# Patient Record
Sex: Female | Born: 1950 | Race: White | Hispanic: No | Marital: Single | Smoking: Never smoker
Health system: Southern US, Community
[De-identification: ages and names within clinical notes are randomized; demographics above are authoritative.]

## PROBLEM LIST (undated history)

## (undated) DIAGNOSIS — F419 Anxiety disorder, unspecified: Secondary | ICD-10-CM

## (undated) HISTORY — PX: DILATION AND CURETTAGE OF UTERUS: SHX78

---

## 1998-10-05 ENCOUNTER — Other Ambulatory Visit: Admission: RE | Admit: 1998-10-05 | Discharge: 1998-10-05 | Payer: Self-pay | Admitting: *Deleted

## 2000-08-08 ENCOUNTER — Other Ambulatory Visit: Admission: RE | Admit: 2000-08-08 | Discharge: 2000-08-08 | Payer: Self-pay | Admitting: *Deleted

## 2001-04-11 ENCOUNTER — Other Ambulatory Visit: Admission: RE | Admit: 2001-04-11 | Discharge: 2001-04-11 | Payer: Self-pay | Admitting: *Deleted

## 2001-04-11 ENCOUNTER — Encounter (INDEPENDENT_AMBULATORY_CARE_PROVIDER_SITE_OTHER): Payer: Self-pay | Admitting: Specialist

## 2005-01-24 ENCOUNTER — Other Ambulatory Visit: Admission: RE | Admit: 2005-01-24 | Discharge: 2005-01-24 | Payer: Self-pay | Admitting: *Deleted

## 2008-09-02 ENCOUNTER — Other Ambulatory Visit: Admission: RE | Admit: 2008-09-02 | Discharge: 2008-09-02 | Payer: Self-pay | Admitting: *Deleted

## 2010-02-07 DIAGNOSIS — K219 Gastro-esophageal reflux disease without esophagitis: Secondary | ICD-10-CM | POA: Insufficient documentation

## 2010-02-07 DIAGNOSIS — J302 Other seasonal allergic rhinitis: Secondary | ICD-10-CM | POA: Insufficient documentation

## 2010-02-07 DIAGNOSIS — D126 Benign neoplasm of colon, unspecified: Secondary | ICD-10-CM | POA: Insufficient documentation

## 2010-02-07 DIAGNOSIS — Z8679 Personal history of other diseases of the circulatory system: Secondary | ICD-10-CM | POA: Insufficient documentation

## 2010-04-03 DIAGNOSIS — F5104 Psychophysiologic insomnia: Secondary | ICD-10-CM | POA: Insufficient documentation

## 2010-04-03 DIAGNOSIS — R5383 Other fatigue: Secondary | ICD-10-CM | POA: Insufficient documentation

## 2013-03-12 DIAGNOSIS — R350 Frequency of micturition: Secondary | ICD-10-CM | POA: Insufficient documentation

## 2014-11-23 ENCOUNTER — Encounter (HOSPITAL_COMMUNITY): Payer: Self-pay | Admitting: Emergency Medicine

## 2014-11-23 ENCOUNTER — Emergency Department (HOSPITAL_COMMUNITY)
Admission: EM | Admit: 2014-11-23 | Discharge: 2014-11-24 | Disposition: A | Discharge status: Home / Self Care | Payer: No Typology Code available for payment source | Attending: Emergency Medicine | Admitting: Emergency Medicine

## 2014-11-23 DIAGNOSIS — R197 Diarrhea, unspecified: Secondary | ICD-10-CM | POA: Diagnosis not present

## 2014-11-23 DIAGNOSIS — Z8659 Personal history of other mental and behavioral disorders: Secondary | ICD-10-CM | POA: Diagnosis not present

## 2014-11-23 DIAGNOSIS — R112 Nausea with vomiting, unspecified: Secondary | ICD-10-CM

## 2014-11-23 DIAGNOSIS — M791 Myalgia: Secondary | ICD-10-CM | POA: Diagnosis not present

## 2014-11-23 HISTORY — DX: Anxiety disorder, unspecified: F41.9

## 2014-11-23 NOTE — ED Notes (Signed)
Pt. reports emesis , diarrhea , body aches and fatigue onset this morning

## 2014-11-24 ENCOUNTER — Encounter (HOSPITAL_COMMUNITY): Payer: Self-pay | Admitting: Emergency Medicine

## 2014-11-24 LAB — CBC WITH DIFFERENTIAL/PLATELET
Basophils Absolute: 0 10*3/uL (ref 0.0–0.1)
Basophils Relative: 0 % (ref 0–1)
EOS PCT: 0 % (ref 0–5)
Eosinophils Absolute: 0 10*3/uL (ref 0.0–0.7)
HEMATOCRIT: 40 % (ref 36.0–46.0)
Hemoglobin: 13.7 g/dL (ref 12.0–15.0)
LYMPHS ABS: 0.2 10*3/uL — AB (ref 0.7–4.0)
Lymphocytes Relative: 3 % — ABNORMAL LOW (ref 12–46)
MCH: 30 pg (ref 26.0–34.0)
MCHC: 34.3 g/dL (ref 30.0–36.0)
MCV: 87.7 fL (ref 78.0–100.0)
Monocytes Absolute: 0.4 10*3/uL (ref 0.1–1.0)
Monocytes Relative: 6 % (ref 3–12)
NEUTROS ABS: 5.1 10*3/uL (ref 1.7–7.7)
Neutrophils Relative %: 91 % — ABNORMAL HIGH (ref 43–77)
Platelets: 139 10*3/uL — ABNORMAL LOW (ref 150–400)
RBC: 4.56 MIL/uL (ref 3.87–5.11)
RDW: 13.7 % (ref 11.5–15.5)
WBC: 5.6 10*3/uL (ref 4.0–10.5)

## 2014-11-24 LAB — COMPREHENSIVE METABOLIC PANEL
ALK PHOS: 55 U/L (ref 39–117)
ALT: 17 U/L (ref 0–35)
AST: 23 U/L (ref 0–37)
Albumin: 4.1 g/dL (ref 3.5–5.2)
Anion gap: 11 (ref 5–15)
BILIRUBIN TOTAL: 0.6 mg/dL (ref 0.3–1.2)
BUN: 15 mg/dL (ref 6–23)
CALCIUM: 9 mg/dL (ref 8.4–10.5)
CHLORIDE: 102 meq/L (ref 96–112)
CO2: 19 mmol/L (ref 19–32)
Creatinine, Ser: 0.99 mg/dL (ref 0.50–1.10)
GFR calc Af Amer: 69 mL/min — ABNORMAL LOW (ref >90)
GFR, EST NON AFRICAN AMERICAN: 60 mL/min — AB (ref >90)
GLUCOSE: 131 mg/dL — AB (ref 70–99)
Potassium: 3.5 mmol/L (ref 3.5–5.1)
SODIUM: 132 mmol/L — AB (ref 135–145)
Total Protein: 6.3 g/dL (ref 6.0–8.3)

## 2014-11-24 MED ORDER — KETOROLAC TROMETHAMINE 30 MG/ML IJ SOLN
30.0000 mg | Freq: Once | INTRAMUSCULAR | Status: AC
  Administered 2014-11-24: 30 mg via INTRAVENOUS
  Filled 2014-11-24: qty 1

## 2014-11-24 MED ORDER — SODIUM CHLORIDE 0.9 % IV BOLUS (SEPSIS)
1000.0000 mL | Freq: Once | INTRAVENOUS | Status: AC
  Administered 2014-11-24: 1000 mL via INTRAVENOUS

## 2014-11-24 MED ORDER — DICYCLOMINE HCL 10 MG/ML IM SOLN
20.0000 mg | Freq: Once | INTRAMUSCULAR | Status: AC
  Administered 2014-11-24: 20 mg via INTRAMUSCULAR
  Filled 2014-11-24: qty 2

## 2014-11-24 MED ORDER — ONDANSETRON HCL 4 MG/2ML IJ SOLN
4.0000 mg | Freq: Once | INTRAMUSCULAR | Status: AC
  Administered 2014-11-24: 4 mg via INTRAVENOUS
  Filled 2014-11-24: qty 2

## 2014-11-24 NOTE — ED Provider Notes (Signed)
CSN: [id number]     Arrival date & time 11/23/14  2311 History  This chart was scribed for Alicia Furukawa Alfonso Patten, MD by Evelene Croon, ED Scribe. This patient was seen in room B14C/B14C and the patient's care was started 12:05 AM.   Chief Complaint  Patient presents with  . Emesis  . Diarrhea     Patient is a 63 y.o. female presenting with vomiting and diarrhea. The history is provided by the patient. No language interpreter was used.  Emesis Severity:  Mild Duration:  1 day Timing:  Rare Quality:  Stomach contents Progression:  Resolved Chronicity:  New Recent urination:  Normal Relieved by:  Nothing Worsened by:  Nothing tried Associated symptoms: diarrhea and myalgias   Associated symptoms: no chills   Diarrhea:    Quality:  Watery   Severity:  Moderate   Duration:  1 day   Timing:  Sporadic   Progression:  Resolved Risk factors: no alcohol use   Diarrhea Associated symptoms: myalgias and vomiting   Associated symptoms: no chills and no fever      HPI Comments:  Alicia Dominguez is a 63 y.o. female who presents to the Emergency Department complaining of nausea, vomiting and diarrhea since about 0600 this am. She reports 2 episodes of vomiting and multiple episode of diarrhea. She took and imodium-like medication with moderate relief of the diarrhea. The last episode of either was around 1400. She also reports generalized myalgia. She is unsure of sick contacts; notes she has been visiting her husband in the hospital for the past 2 weeks     Past Medical History  Diagnosis Date  . Anxiety    Past Surgical History  Procedure Laterality Date  . Dilation and curettage of uterus     No family history on file. History  Substance Use Topics  . Smoking status: Never Smoker   . Smokeless tobacco: Not on file  . Alcohol Use: No   OB History    No data available     Review of Systems  Constitutional: Negative for fever and chills.  Respiratory: Negative for  shortness of breath.   Cardiovascular: Negative for chest pain.  Gastrointestinal: Positive for vomiting and diarrhea.  Musculoskeletal: Positive for myalgias.  All other systems reviewed and are negative.     Allergies  Codeine and Septra  Home Medications   Prior to Admission medications   Not on File   BP 103/68 mmHg  Pulse 109  Temp(Src) 99 F (37.2 C) (Oral)  Resp 14  Ht 5\' 4"  (1.626 m)  Wt 133 lb (60.328 kg)  BMI 22.82 kg/m2  SpO2 97% Physical Exam  Constitutional: She is oriented to person, place, and time. She appears well-developed and well-nourished. No distress.  HENT:  Head: Normocephalic and atraumatic.  Mouth/Throat: Oropharynx is clear and moist.  Eyes: Conjunctivae are normal. Pupils are equal, round, and reactive to light.  Neck: Normal range of motion. Neck supple.  Cardiovascular: Normal rate, regular rhythm and normal heart sounds.   Pulmonary/Chest: Effort normal and breath sounds normal. No respiratory distress. She has no wheezes. She has no rales.  Abdominal: Soft. She exhibits no distension. There is no tenderness. There is no rebound and no guarding.  Diffusely hyperactive bowel sounds  Musculoskeletal: Normal range of motion.  Neurological: She is alert and oriented to person, place, and time. She has normal reflexes.  Skin: Skin is warm and dry. She is not diaphoretic.  Psychiatric: She has a normal mood  and affect.  Nursing note and vitals reviewed.   ED Course  Procedures   DIAGNOSTIC STUDIES:  Oxygen Saturation is 97% on RA, normal by my interpretation.    COORDINATION OF CARE:  12:12 AM Discussed treatment plan with pt at bedside and pt agreed to plan.  Labs Review Labs Reviewed  CBC WITH DIFFERENTIAL  COMPREHENSIVE METABOLIC PANEL    Imaging Review No results found.   EKG Interpretation None      MDM   Final diagnoses:  None   Symptoms are consistent with viral n/v/d.  Likely picked up visiting loved on at  hospital.   Medications  sodium chloride 0.9 % bolus 1,000 mL (0 mLs Intravenous Stopped 11/24/14 0106)  ondansetron (ZOFRAN) injection 4 mg (4 mg Intravenous Given 11/24/14 0017)  ketorolac (TORADOL) 30 MG/ML injection 30 mg (30 mg Intravenous Given 11/24/14 0018)  dicyclomine (BENTYL) injection 20 mg (20 mg Intramuscular Given 11/24/14 0104)   Improved post medication PO challenged successfully.  I personally performed the services described in this documentation, which was scribed in my presence. The recorded information has been reviewed and is accurate.     Carlisle Beers, MD 11/24/14 [phone]

## 2014-11-24 NOTE — ED Notes (Signed)
Pt given ginger ale and crackers per EDP for a PO challenge.

## 2017-01-09 DIAGNOSIS — H18603 Keratoconus, unspecified, bilateral: Secondary | ICD-10-CM | POA: Diagnosis not present

## 2017-01-09 DIAGNOSIS — H25813 Combined forms of age-related cataract, bilateral: Secondary | ICD-10-CM | POA: Diagnosis not present

## 2017-01-09 DIAGNOSIS — H1789 Other corneal scars and opacities: Secondary | ICD-10-CM | POA: Diagnosis not present

## 2017-01-18 DIAGNOSIS — Z6822 Body mass index (BMI) 22.0-22.9, adult: Secondary | ICD-10-CM | POA: Diagnosis not present

## 2017-01-18 DIAGNOSIS — K219 Gastro-esophageal reflux disease without esophagitis: Secondary | ICD-10-CM | POA: Diagnosis not present

## 2017-01-18 DIAGNOSIS — K589 Irritable bowel syndrome without diarrhea: Secondary | ICD-10-CM | POA: Insufficient documentation

## 2017-01-18 DIAGNOSIS — R197 Diarrhea, unspecified: Secondary | ICD-10-CM | POA: Diagnosis not present

## 2017-01-18 DIAGNOSIS — R634 Abnormal weight loss: Secondary | ICD-10-CM | POA: Insufficient documentation

## 2017-01-18 DIAGNOSIS — R5383 Other fatigue: Secondary | ICD-10-CM | POA: Diagnosis not present

## 2017-01-31 DIAGNOSIS — Z79899 Other long term (current) drug therapy: Secondary | ICD-10-CM | POA: Diagnosis not present

## 2017-01-31 DIAGNOSIS — R5383 Other fatigue: Secondary | ICD-10-CM | POA: Diagnosis not present

## 2017-02-07 DIAGNOSIS — Z1389 Encounter for screening for other disorder: Secondary | ICD-10-CM | POA: Diagnosis not present

## 2017-02-07 DIAGNOSIS — K219 Gastro-esophageal reflux disease without esophagitis: Secondary | ICD-10-CM | POA: Diagnosis not present

## 2017-02-07 DIAGNOSIS — R030 Elevated blood-pressure reading, without diagnosis of hypertension: Secondary | ICD-10-CM | POA: Insufficient documentation

## 2017-02-07 DIAGNOSIS — Z6822 Body mass index (BMI) 22.0-22.9, adult: Secondary | ICD-10-CM | POA: Diagnosis not present

## 2017-02-07 DIAGNOSIS — Z0000 Encounter for general adult medical examination without abnormal findings: Secondary | ICD-10-CM | POA: Diagnosis not present

## 2017-02-07 DIAGNOSIS — M542 Cervicalgia: Secondary | ICD-10-CM | POA: Diagnosis not present

## 2017-02-07 DIAGNOSIS — F5104 Psychophysiologic insomnia: Secondary | ICD-10-CM | POA: Diagnosis not present

## 2017-02-07 DIAGNOSIS — N183 Chronic kidney disease, stage 3 (moderate): Secondary | ICD-10-CM | POA: Diagnosis not present

## 2017-02-14 DIAGNOSIS — Z1212 Encounter for screening for malignant neoplasm of rectum: Secondary | ICD-10-CM | POA: Diagnosis not present

## 2017-02-15 DIAGNOSIS — Z6822 Body mass index (BMI) 22.0-22.9, adult: Secondary | ICD-10-CM | POA: Diagnosis not present

## 2017-02-15 DIAGNOSIS — R112 Nausea with vomiting, unspecified: Secondary | ICD-10-CM | POA: Diagnosis not present

## 2017-02-15 DIAGNOSIS — K219 Gastro-esophageal reflux disease without esophagitis: Secondary | ICD-10-CM | POA: Diagnosis not present

## 2017-02-18 DIAGNOSIS — T39395A Adverse effect of other nonsteroidal anti-inflammatory drugs [NSAID], initial encounter: Secondary | ICD-10-CM | POA: Insufficient documentation

## 2017-02-18 DIAGNOSIS — K2960 Other gastritis without bleeding: Secondary | ICD-10-CM | POA: Diagnosis not present

## 2017-07-09 DIAGNOSIS — Z01419 Encounter for gynecological examination (general) (routine) without abnormal findings: Secondary | ICD-10-CM | POA: Diagnosis not present

## 2017-07-09 DIAGNOSIS — M8588 Other specified disorders of bone density and structure, other site: Secondary | ICD-10-CM | POA: Diagnosis not present

## 2017-07-09 DIAGNOSIS — Z6822 Body mass index (BMI) 22.0-22.9, adult: Secondary | ICD-10-CM | POA: Diagnosis not present

## 2017-07-09 DIAGNOSIS — N958 Other specified menopausal and perimenopausal disorders: Secondary | ICD-10-CM | POA: Diagnosis not present

## 2017-07-09 DIAGNOSIS — Z124 Encounter for screening for malignant neoplasm of cervix: Secondary | ICD-10-CM | POA: Diagnosis not present

## 2017-07-09 DIAGNOSIS — Z1231 Encounter for screening mammogram for malignant neoplasm of breast: Secondary | ICD-10-CM | POA: Diagnosis not present

## 2017-09-11 ENCOUNTER — Other Ambulatory Visit: Payer: Self-pay | Admitting: Internal Medicine

## 2017-09-11 DIAGNOSIS — K219 Gastro-esophageal reflux disease without esophagitis: Secondary | ICD-10-CM | POA: Diagnosis not present

## 2017-09-11 DIAGNOSIS — R112 Nausea with vomiting, unspecified: Secondary | ICD-10-CM | POA: Diagnosis not present

## 2017-09-11 DIAGNOSIS — F418 Other specified anxiety disorders: Secondary | ICD-10-CM | POA: Diagnosis not present

## 2017-09-11 DIAGNOSIS — Z23 Encounter for immunization: Secondary | ICD-10-CM | POA: Diagnosis not present

## 2017-09-11 DIAGNOSIS — Z6821 Body mass index (BMI) 21.0-21.9, adult: Secondary | ICD-10-CM | POA: Diagnosis not present

## 2017-09-11 DIAGNOSIS — K589 Irritable bowel syndrome without diarrhea: Secondary | ICD-10-CM | POA: Diagnosis not present

## 2017-09-11 DIAGNOSIS — F419 Anxiety disorder, unspecified: Secondary | ICD-10-CM | POA: Insufficient documentation

## 2017-09-13 ENCOUNTER — Other Ambulatory Visit: Payer: Self-pay

## 2017-09-16 ENCOUNTER — Ambulatory Visit
Admission: RE | Admit: 2017-09-16 | Discharge: 2017-09-16 | Disposition: A | Discharge status: Home / Self Care | Payer: Medicare Other | Source: Ambulatory Visit | Attending: Internal Medicine | Admitting: Internal Medicine

## 2017-09-16 DIAGNOSIS — R112 Nausea with vomiting, unspecified: Secondary | ICD-10-CM

## 2017-09-16 DIAGNOSIS — K8020 Calculus of gallbladder without cholecystitis without obstruction: Secondary | ICD-10-CM | POA: Diagnosis not present

## 2017-09-18 DIAGNOSIS — K8020 Calculus of gallbladder without cholecystitis without obstruction: Secondary | ICD-10-CM | POA: Insufficient documentation

## 2017-10-04 DIAGNOSIS — K8020 Calculus of gallbladder without cholecystitis without obstruction: Secondary | ICD-10-CM | POA: Diagnosis not present

## 2017-10-22 DIAGNOSIS — F32A Depression, unspecified: Secondary | ICD-10-CM | POA: Insufficient documentation

## 2017-10-22 DIAGNOSIS — S61452A Open bite of left hand, initial encounter: Secondary | ICD-10-CM | POA: Diagnosis not present

## 2018-02-10 DIAGNOSIS — Z79899 Other long term (current) drug therapy: Secondary | ICD-10-CM | POA: Diagnosis not present

## 2018-02-10 DIAGNOSIS — N183 Chronic kidney disease, stage 3 (moderate): Secondary | ICD-10-CM | POA: Diagnosis not present

## 2018-02-10 DIAGNOSIS — R82998 Other abnormal findings in urine: Secondary | ICD-10-CM | POA: Diagnosis not present

## 2018-02-17 DIAGNOSIS — Z0000 Encounter for general adult medical examination without abnormal findings: Secondary | ICD-10-CM | POA: Diagnosis not present

## 2018-02-17 DIAGNOSIS — R030 Elevated blood-pressure reading, without diagnosis of hypertension: Secondary | ICD-10-CM | POA: Diagnosis not present

## 2018-02-17 DIAGNOSIS — K8020 Calculus of gallbladder without cholecystitis without obstruction: Secondary | ICD-10-CM | POA: Diagnosis not present

## 2018-02-17 DIAGNOSIS — F5104 Psychophysiologic insomnia: Secondary | ICD-10-CM | POA: Diagnosis not present

## 2018-02-17 DIAGNOSIS — F418 Other specified anxiety disorders: Secondary | ICD-10-CM | POA: Diagnosis not present

## 2018-02-17 DIAGNOSIS — Z1389 Encounter for screening for other disorder: Secondary | ICD-10-CM | POA: Diagnosis not present

## 2018-02-17 DIAGNOSIS — Z6823 Body mass index (BMI) 23.0-23.9, adult: Secondary | ICD-10-CM | POA: Diagnosis not present

## 2018-07-17 DIAGNOSIS — L239 Allergic contact dermatitis, unspecified cause: Secondary | ICD-10-CM | POA: Diagnosis not present

## 2019-02-17 DIAGNOSIS — Z79899 Other long term (current) drug therapy: Secondary | ICD-10-CM | POA: Diagnosis not present

## 2019-02-17 DIAGNOSIS — Z0000 Encounter for general adult medical examination without abnormal findings: Secondary | ICD-10-CM | POA: Insufficient documentation

## 2019-03-06 DIAGNOSIS — H9193 Unspecified hearing loss, bilateral: Secondary | ICD-10-CM | POA: Diagnosis not present

## 2019-03-06 DIAGNOSIS — F418 Other specified anxiety disorders: Secondary | ICD-10-CM | POA: Diagnosis not present

## 2019-03-06 DIAGNOSIS — F5104 Psychophysiologic insomnia: Secondary | ICD-10-CM | POA: Diagnosis not present

## 2019-03-06 DIAGNOSIS — R197 Diarrhea, unspecified: Secondary | ICD-10-CM | POA: Diagnosis not present

## 2019-03-06 DIAGNOSIS — Z1339 Encounter for screening examination for other mental health and behavioral disorders: Secondary | ICD-10-CM | POA: Diagnosis not present

## 2019-03-06 DIAGNOSIS — Z1331 Encounter for screening for depression: Secondary | ICD-10-CM | POA: Diagnosis not present

## 2019-03-06 DIAGNOSIS — K219 Gastro-esophageal reflux disease without esophagitis: Secondary | ICD-10-CM | POA: Diagnosis not present

## 2019-03-06 DIAGNOSIS — Z0000 Encounter for general adult medical examination without abnormal findings: Secondary | ICD-10-CM | POA: Diagnosis not present

## 2019-03-06 DIAGNOSIS — H9190 Unspecified hearing loss, unspecified ear: Secondary | ICD-10-CM | POA: Insufficient documentation

## 2019-12-24 ENCOUNTER — Encounter (HOSPITAL_COMMUNITY): Payer: Self-pay | Admitting: Emergency Medicine

## 2019-12-24 ENCOUNTER — Emergency Department (HOSPITAL_COMMUNITY)
Admission: EM | Admit: 2019-12-24 | Discharge: 2019-12-24 | Disposition: A | Discharge status: Home / Self Care | Payer: Medicare HMO | Attending: Emergency Medicine | Admitting: Emergency Medicine

## 2019-12-24 ENCOUNTER — Ambulatory Visit: Payer: Medicare HMO

## 2019-12-24 ENCOUNTER — Other Ambulatory Visit: Payer: Self-pay

## 2019-12-24 ENCOUNTER — Emergency Department (HOSPITAL_COMMUNITY): Payer: Medicare HMO

## 2019-12-24 DIAGNOSIS — Z79899 Other long term (current) drug therapy: Secondary | ICD-10-CM | POA: Insufficient documentation

## 2019-12-24 DIAGNOSIS — I10 Essential (primary) hypertension: Secondary | ICD-10-CM | POA: Diagnosis not present

## 2019-12-24 DIAGNOSIS — E876 Hypokalemia: Secondary | ICD-10-CM

## 2019-12-24 DIAGNOSIS — R55 Syncope and collapse: Secondary | ICD-10-CM | POA: Diagnosis not present

## 2019-12-24 DIAGNOSIS — Z20822 Contact with and (suspected) exposure to covid-19: Secondary | ICD-10-CM | POA: Insufficient documentation

## 2019-12-24 DIAGNOSIS — E860 Dehydration: Secondary | ICD-10-CM

## 2019-12-24 DIAGNOSIS — R202 Paresthesia of skin: Secondary | ICD-10-CM | POA: Diagnosis not present

## 2019-12-24 DIAGNOSIS — R000 Tachycardia, unspecified: Secondary | ICD-10-CM | POA: Diagnosis not present

## 2019-12-24 DIAGNOSIS — R112 Nausea with vomiting, unspecified: Secondary | ICD-10-CM

## 2019-12-24 DIAGNOSIS — I491 Atrial premature depolarization: Secondary | ICD-10-CM | POA: Diagnosis not present

## 2019-12-24 DIAGNOSIS — R29810 Facial weakness: Secondary | ICD-10-CM | POA: Diagnosis not present

## 2019-12-24 DIAGNOSIS — R531 Weakness: Secondary | ICD-10-CM | POA: Diagnosis not present

## 2019-12-24 DIAGNOSIS — Z23 Encounter for immunization: Secondary | ICD-10-CM

## 2019-12-24 LAB — HEPATIC FUNCTION PANEL
ALT: 17 U/L (ref 0–44)
AST: 18 U/L (ref 15–41)
Albumin: 3.8 g/dL (ref 3.5–5.0)
Alkaline Phosphatase: 72 U/L (ref 38–126)
Bilirubin, Direct: 0.1 mg/dL (ref 0.0–0.2)
Indirect Bilirubin: 0.4 mg/dL (ref 0.3–0.9)
Total Bilirubin: 0.5 mg/dL (ref 0.3–1.2)
Total Protein: 6.5 g/dL (ref 6.5–8.1)

## 2019-12-24 LAB — URINALYSIS, ROUTINE W REFLEX MICROSCOPIC
Bilirubin Urine: NEGATIVE
Glucose, UA: NEGATIVE mg/dL
Hgb urine dipstick: NEGATIVE
Ketones, ur: NEGATIVE mg/dL
Leukocytes,Ua: NEGATIVE
Nitrite: NEGATIVE
Protein, ur: NEGATIVE mg/dL
Specific Gravity, Urine: 1.005 (ref 1.005–1.030)
pH: 6 (ref 5.0–8.0)

## 2019-12-24 LAB — CBC
HCT: 39.1 % (ref 36.0–46.0)
Hemoglobin: 12.8 g/dL (ref 12.0–15.0)
MCH: 29.1 pg (ref 26.0–34.0)
MCHC: 32.7 g/dL (ref 30.0–36.0)
MCV: 88.9 fL (ref 80.0–100.0)
Platelets: 191 10*3/uL (ref 150–400)
RBC: 4.4 MIL/uL (ref 3.87–5.11)
RDW: 12.8 % (ref 11.5–15.5)
WBC: 4.3 10*3/uL (ref 4.0–10.5)
nRBC: 0 % (ref 0.0–0.2)

## 2019-12-24 LAB — BASIC METABOLIC PANEL
Anion gap: 12 (ref 5–15)
BUN: 10 mg/dL (ref 8–23)
CO2: 23 mmol/L (ref 22–32)
Calcium: 8.9 mg/dL (ref 8.9–10.3)
Chloride: 107 mmol/L (ref 98–111)
Creatinine, Ser: 0.95 mg/dL (ref 0.44–1.00)
GFR calc Af Amer: >60 mL/min (ref >60)
GFR calc non Af Amer: >60 mL/min (ref >60)
Glucose, Bld: 144 mg/dL — ABNORMAL HIGH (ref 70–99)
Potassium: 2.8 mmol/L — ABNORMAL LOW (ref 3.5–5.1)
Sodium: 142 mmol/L (ref 135–145)

## 2019-12-24 LAB — LIPASE, BLOOD: Lipase: 49 U/L (ref 11–51)

## 2019-12-24 LAB — POC SARS CORONAVIRUS 2 AG -  ED: SARS Coronavirus 2 Ag: NEGATIVE

## 2019-12-24 LAB — MAGNESIUM: Magnesium: 1.9 mg/dL (ref 1.7–2.4)

## 2019-12-24 LAB — CBG MONITORING, ED: Glucose-Capillary: 128 mg/dL — ABNORMAL HIGH (ref 70–99)

## 2019-12-24 MED ORDER — SODIUM CHLORIDE 0.9 % IV BOLUS (SEPSIS)
1000.0000 mL | Freq: Once | INTRAVENOUS | Status: AC
  Administered 2019-12-24: 1000 mL via INTRAVENOUS

## 2019-12-24 MED ORDER — POTASSIUM CHLORIDE CRYS ER 20 MEQ PO TBCR
40.0000 meq | EXTENDED_RELEASE_TABLET | Freq: Once | ORAL | Status: AC
  Administered 2019-12-24: 40 meq via ORAL
  Filled 2019-12-24: qty 2

## 2019-12-24 MED ORDER — POTASSIUM CHLORIDE ER 10 MEQ PO TBCR: 10.0000 meq | EXTENDED_RELEASE_TABLET | Freq: Every day | ORAL | 0 refills | Status: DC

## 2019-12-24 NOTE — ED Provider Notes (Signed)
[facility] Provider Note   CSN: [id number] Arrival date & time: 12/24/19  [id number]     History Chief Complaint  Patient presents with  . Weakness    Alicia Dominguez is a 69 y.o. female.  Patient is a 69 year old female with past medical history of anxiety presenting to the emergency department for near syncope this morning.  Patient reports that she woke up and walked down the stairs this morning and suddenly felt weak like she was going to pass out.  Reports feeling tingling sensation in her hands and feet.  She called 911 after this.  She reports that she has had some nausea, vomiting and diarrhea over the last 2 or 3 days.  Denies any abdominal pain, chest pain, shortness of breath, URI symptoms, fevers, dysuria.  In route EMS did orthostatic blood pressures which were positive and so fluids were started.  Patient reports that she is now feeling better after fluids.        Past Medical History:  Diagnosis Date  . Anxiety     There are no problems to display for this patient.   Past Surgical History:  Procedure Laterality Date  . DILATION AND CURETTAGE OF UTERUS       OB History   No obstetric history on file.     No family history on file.  Social History   Tobacco Use  . Smoking status: Never Smoker  Substance Use Topics  . Alcohol use: No  . Drug use: No    Home Medications Prior to Admission medications   Medication Sig Start Date End Date Taking? Authorizing Provider  acetaminophen (TYLENOL) 325 MG tablet Take 650 mg by mouth every 6 (six) hours as needed for fever.   Yes [provider]  esomeprazole (NEXIUM) 20 MG capsule Take 20 mg by mouth daily.   Yes [provider]  FLUoxetine (PROZAC) 20 MG capsule Take 20 mg by mouth daily. 11/01/19  Yes [provider]  loratadine (CLARITIN) 10 MG tablet Take 10 mg by mouth daily.   Yes [provider]  Probiotic Product (PROBIOTIC PO)  Take 1 capsule by mouth daily.   Yes [provider]  potassium chloride (KLOR-CON) 10 MEQ tablet Take 1 tablet (10 mEq total) by mouth daily for 5 days. 12/24/19 12/29/19  Alveria Apley, PA-C    Allergies    Codeine and Septra [sulfamethoxazole-trimethoprim]  Review of Systems   Review of Systems  Constitutional: Positive for fatigue. Negative for activity change, appetite change, chills, diaphoresis and fever.  HENT: Negative for congestion, rhinorrhea and sore throat.   Eyes: Negative for visual disturbance.  Respiratory: Negative for cough and shortness of breath.   Cardiovascular: Negative for chest pain.  Gastrointestinal: Positive for diarrhea, nausea and vomiting. Negative for abdominal pain, blood in stool, constipation and rectal pain.  Genitourinary: Negative for dysuria.  Musculoskeletal: Negative for back pain.  Skin: Negative for rash and wound.  Neurological: Positive for light-headedness. Negative for dizziness, syncope, facial asymmetry, speech difficulty, weakness, numbness and headaches.  Psychiatric/Behavioral: Negative for confusion.    Physical Exam Updated Vital Signs BP (!) 142/71   Pulse 93   Temp 98.6 F (37 C) (Oral)   Resp 13   Ht 5\' 4"  (1.626 m)   Wt 59 kg   SpO2 100%   BMI 22.31 kg/m   Physical Exam Vitals and nursing note reviewed.  Constitutional:      General: She is not in  acute distress.    Appearance: Normal appearance. She is normal weight. She is not ill-appearing, toxic-appearing or diaphoretic.  HENT:     Head: Normocephalic and atraumatic.     Mouth/Throat:     Mouth: Mucous membranes are moist.  Eyes:     Conjunctiva/sclera: Conjunctivae normal.  Cardiovascular:     Rate and Rhythm: Normal rate and regular rhythm.  Pulmonary:     Effort: Pulmonary effort is normal.     Breath sounds: Normal breath sounds.  Abdominal:     General: Abdomen is flat. There is no distension.     Tenderness: There is no abdominal  tenderness. There is no guarding.  Skin:    General: Skin is warm and dry.     Coloration: Skin is not pale.     Findings: No bruising or erythema.  Neurological:     Mental Status: She is alert.  Psychiatric:        Mood and Affect: Mood normal.     ED Results / Procedures / Treatments   Labs (all labs ordered are listed, but only abnormal results are displayed) Labs Reviewed  BASIC METABOLIC PANEL - Abnormal; Notable for the following components:      Result Value   Potassium 2.8 (*)    Glucose, Bld 144 (*)    All other components within normal limits  CBG MONITORING, ED - Abnormal; Notable for the following components:   Glucose-Capillary 128 (*)    All other components within normal limits  CBC  LIPASE, BLOOD  HEPATIC FUNCTION PANEL  MAGNESIUM  URINALYSIS, ROUTINE W REFLEX MICROSCOPIC  POC SARS CORONAVIRUS 2 AG -  ED    EKG EKG Interpretation  Date/Time:  Thursday December 24 2019 07:11:48 EST Ventricular Rate:  93 PR Interval:  158 QRS Duration: 80 QT Interval:  360 QTC Calculation: 447 R Axis:   36 Text Interpretation: Normal sinus rhythm with sinus arrhythmia nonspecific T waves No old tracing to compare Confirmed by Sherwood Gambler [phone]) on 12/24/2019 7:41:08 AM   Radiology DG Chest Portable 1 View  Result Date: 12/24/2019 CLINICAL DATA:  Near syncope EXAM: PORTABLE CHEST 1 VIEW COMPARISON:  None. FINDINGS: Lungs are clear. Heart size and pulmonary vascularity are normal. No adenopathy. No bone lesions. IMPRESSION: Lungs clear.  Cardiac silhouette normal.  No adenopathy. Electronically Signed   By: Lowella Grip III M.D.   On: 12/24/2019 08:30    Procedures Procedures (including critical care time)  Medications Ordered in ED Medications  sodium chloride 0.9 % bolus 1,000 mL (1,000 mLs Intravenous New Bag/Given 12/24/19 0815)  potassium chloride SA (KLOR-CON) CR tablet 40 mEq (40 mEq Oral Given 12/24/19 [id number])    ED Course  I have reviewed the  triage vital signs and the nursing notes.  Pertinent labs & imaging results that were available during my care of the patient were reviewed by me and considered in my medical decision making (see chart for details).  Clinical Course as of Dec 23 1004  Thu Dec 24, 2019  0900 Patient presenting with n/v/d and orthostatic hypotension. Overall well appearing, potassium of 2.8    [KM]  1004 Patient is feeling much improved after fluids.  Unremarkable work-up other than hypokalemia.  Given oral potassium and counseled on return precautions.  Patient also seen and evaluated by Dr. Verner Chol and plan agreed upon.   [KM]    Clinical Course User Index [KM] Kristine Royal   MDM Rules/Calculators/A&P  Based on review of vitals, medical screening exam, lab work and/or imaging, there does not appear to be an acute, emergent etiology for the patient's symptoms. Counseled pt on good return precautions and encouraged both PCP and ED follow-up as needed.  Prior to discharge, I also discussed incidental imaging findings with patient in detail and advised appropriate, recommended follow-up in detail.  Clinical Impression: 1. Dehydration   2. Nausea and vomiting, intractability of vomiting not specified, unspecified vomiting type   3. Hypokalemia     Disposition: Discharge  Prior to providing a prescription for a controlled substance, I independently reviewed the patient's recent prescription history on the [place]. The patient had no recent or regular prescriptions and was deemed appropriate for a brief, less than 3 day prescription of narcotic for acute analgesia.  This note was prepared with assistance of Systems analyst. Occasional wrong-word or sound-a-like substitutions may have occurred due to the inherent limitations of voice recognition software.  Final Clinical Impression(s) / ED Diagnoses Final  diagnoses:  Dehydration  Nausea and vomiting, intractability of vomiting not specified, unspecified vomiting type  Hypokalemia    Rx / DC Orders ED Discharge Orders         Ordered    potassium chloride (KLOR-CON) 10 MEQ tablet  Daily     12/24/19 1005           Kristine Royal 12/24/19 1006    Sherwood Gambler, MD 12/25/19 [phone]

## 2019-12-24 NOTE — ED Notes (Signed)
ED Provider at bedside. 

## 2019-12-24 NOTE — Progress Notes (Signed)
   Covid-19 Vaccination Clinic  Name:  Alicia Dominguez    MRN: [id number] DOB: 04/14/1951  12/24/2019  [name] was observed post Covid-19 immunization for 15 minutes without incidence. She was provided with Vaccine Information Sheet and instruction to access the V-Safe system.   [name] was instructed to call 911 with any severe reactions post vaccine: Marland Kitchen Difficulty breathing  . Swelling of your face and throat  . A fast heartbeat  . A bad rash all over your body  . Dizziness and weakness    Immunizations Administered    Name Date Dose VIS Date Route   Pfizer COVID-19 Vaccine 12/24/2019  5:55 PM 0.3 mL 11/13/2019 Intramuscular   Manufacturer: [place]   Lot: [id number]   [place]: [id number]

## 2019-12-24 NOTE — Discharge Instructions (Signed)
You are seen today for feeling unwell and almost passing out.  Your work-up revealed your potassium was low which is likely due to your vomiting and diarrhea.  We gave you some potassium pills.  He also appeared very dehydrated and your blood pressure dropped with standing.  This resolved after giving you IV fluids.  Please make sure you go home and stay hydrated with water and Gatorade.  Avoid caffeinated beverages as this can further dehydrate you. Thank you for allowing me to care for you today. Please return to the emergency department if you have new or worsening symptoms. Take your medications as instructed.

## 2019-12-24 NOTE — ED Notes (Signed)
Pt was able to ambulated from room to bathroom with steady gait and NO assistance needed. Pt stated "I feel fine." Pt had NO SOB or dizziness.

## 2019-12-24 NOTE — ED Triage Notes (Signed)
Pt arrives via EMS from home with reports of bilateral leg weakness and pins and needles feelings. Positive ortho for EMS. 160/80 sitting, 120/84 standing. Reports frequent N/V/D this past week but is her normal. 300 NS given, 20G LAC

## 2020-01-11 ENCOUNTER — Ambulatory Visit: Payer: Medicare HMO | Attending: Internal Medicine

## 2020-01-11 DIAGNOSIS — Z23 Encounter for immunization: Secondary | ICD-10-CM | POA: Insufficient documentation

## 2020-01-11 NOTE — Progress Notes (Signed)
   Covid-19 Vaccination Clinic  Name:  Jennylee Dellarocca    MRN: [id number] DOB: January 12, 1951  01/11/2020  Ms. Bowker was observed post Covid-19 immunization for 15 minutes without incidence. She was provided with Vaccine Information Sheet and instruction to access the V-Safe system.   Ms. [place] was instructed to call 911 with any severe reactions post vaccine: Marland Kitchen Difficulty breathing  . Swelling of your face and throat  . A fast heartbeat  . A bad rash all over your body  . Dizziness and weakness    Immunizations Administered    Name Date Dose VIS Date Route   Pfizer COVID-19 Vaccine 01/11/2020 10:11 AM 0.3 mL 11/13/2019 Intramuscular   Manufacturer: [place]   Lot: [id number]   [place]: [id number]

## 2020-01-21 ENCOUNTER — Ambulatory Visit
Admission: EM | Admit: 2020-01-21 | Discharge: 2020-01-21 | Disposition: A | Discharge status: Home / Self Care | Payer: Medicare HMO | Attending: Emergency Medicine | Admitting: Emergency Medicine

## 2020-01-21 ENCOUNTER — Encounter: Payer: Self-pay | Admitting: Emergency Medicine

## 2020-01-21 ENCOUNTER — Other Ambulatory Visit: Payer: Self-pay

## 2020-01-21 DIAGNOSIS — B029 Zoster without complications: Secondary | ICD-10-CM | POA: Diagnosis not present

## 2020-01-21 MED ORDER — VALACYCLOVIR HCL 1 G PO TABS: 1000.0000 mg | ORAL_TABLET | Freq: Three times a day (TID) | ORAL | 0 refills | Status: DC

## 2020-01-21 NOTE — ED Triage Notes (Signed)
Pt presents to [facility] for left lower back pain starting two days after a trip and fall over her dogs falling backwards onto carpet.   Pt states she has been taking 1-200mg  tablet of Ibuprofen every 6 hours for pain relief.  States she also slept on a heating pad, and now has blisters to the area as well.

## 2020-01-21 NOTE — Discharge Instructions (Addendum)
Take antiviral 3 times a day for 1 week. Follow up with PCP Monday via phone. Return for worsening pain, swelling, fever.

## 2020-01-21 NOTE — ED Notes (Signed)
Patient able to ambulate independently  

## 2020-01-21 NOTE — ED Provider Notes (Addendum)
[facility]    CSN: [id number] Arrival date & time: 01/21/20  1101      History   Chief Complaint Chief Complaint  Patient presents with  . Fall    HPI Alicia Dominguez is a 69 y.o. female scented for left low back pain that began after falling 1 week ago.  Denies head trauma, LOC.  No pop/snap/tearing sensation.  Denies saddle anesthesia, lower extremity numbness or weakness.  Has been ambulating well.  Taking 200 mg ibuprofen every 6 hours with moderate pain relief.  States she is seeking evaluation today as she developed worsening pain over the last couple days, now has a burning sensation and rash that she has developed over left posterior hip which she feels is attributed to using heating pad.   Past Medical History:  Diagnosis Date  . Anxiety     There are no problems to display for this patient.   Past Surgical History:  Procedure Laterality Date  . DILATION AND CURETTAGE OF UTERUS      OB History   No obstetric history on file.      Home Medications    Prior to Admission medications   Medication Sig Start Date End Date Taking? Authorizing Provider  acetaminophen (TYLENOL) 325 MG tablet Take 650 mg by mouth every 6 (six) hours as needed for fever.    [provider]  esomeprazole (NEXIUM) 20 MG capsule Take 20 mg by mouth daily.    [provider]  FLUoxetine (PROZAC) 20 MG capsule Take 20 mg by mouth daily. 11/01/19   [provider]  loratadine (CLARITIN) 10 MG tablet Take 10 mg by mouth daily.    [provider]  potassium chloride (KLOR-CON) 10 MEQ tablet Take 1 tablet (10 mEq total) by mouth daily for 5 days. 12/24/19 12/29/19  Madilyn Hook A, PA-C  Probiotic Product (PROBIOTIC PO) Take 1 capsule by mouth daily.    [provider]  valACYclovir (VALTREX) 1000 MG tablet Take 1 tablet (1,000 mg total) by mouth 3 (three) times daily. 01/21/20   Hall-Potvin, [place], PA-C    Family History Family  History  Problem Relation Age of Onset  . Hypertension Mother   . Alzheimer's disease Mother   . Diabetes Mother   . Lung cancer Father     Social History Social History   Tobacco Use  . Smoking status: Never Smoker  . Smokeless tobacco: Never Used  Substance Use Topics  . Alcohol use: No  . Drug use: No     Allergies   Codeine and Septra [sulfamethoxazole-trimethoprim]   Review of Systems As per HPI   Physical Exam Triage Vital Signs ED Triage Vitals  Enc Vitals Group     BP      Pulse      Resp      Temp      Temp src      SpO2      Weight      Height      Head Circumference      Peak Flow      Pain Score      Pain Loc      Pain Edu?      Excl. in [place]?    No data found.  Updated Vital Signs BP (!) 167/89 (BP Location: Left Arm)   Pulse 89   Temp (!) 97.5 F (36.4 C) (Temporal)   Resp 16   SpO2 97%   Visual Acuity Right Eye  Distance:   Left Eye Distance:   Bilateral Distance:    Right Eye Near:   Left Eye Near:    Bilateral Near:     Physical Exam Constitutional:      General: She is not in acute distress. HENT:     Head: Normocephalic and atraumatic.  Eyes:     General: No scleral icterus.    Pupils: Pupils are equal, round, and reactive to light.  Cardiovascular:     Rate and Rhythm: Normal rate.  Pulmonary:     Effort: Pulmonary effort is normal.  Musculoskeletal:        General: No deformity.     Cervical back: Normal.     Thoracic back: Normal.     Lumbar back: No spasms or bony tenderness. Normal range of motion. No scoliosis.       Back:  Skin:    Coloration: Skin is not jaundiced or pale.  Neurological:     Mental Status: She is alert and oriented to person, place, and time.     Gait: Gait normal.     Deep Tendon Reflexes: Reflexes normal.      UC Treatments / Results  Labs (all labs ordered are listed, but only abnormal results are displayed) Labs Reviewed - No data to display  EKG   Radiology No results  found.  Procedures Procedures (including critical care time)  Medications Ordered in UC Medications - No data to display  Initial Impression / Assessment and Plan / UC Course  I have reviewed the triage vital signs and the nursing notes.  Pertinent labs & imaging results that were available during my care of the patient were reviewed by me and considered in my medical decision making (see chart for details).     Afebrile, nontoxic in office today.  H&P consistent with shingles: New lesions within the last 72 hours-we will start valacyclovir as outlined below.  Patient to follow-up with PCP for further evaluation/management if needed.  We will also increase ibuprofen dosing for musculoskeletal pain status post fall.  Return precautions discussed, patient verbalized understanding and is agreeable to plan. Final Clinical Impressions(s) / UC Diagnoses   Final diagnoses:  Herpes zoster without complication     Discharge Instructions     Take antiviral 3 times a day for 1 week. Follow up with PCP Monday via phone. Return for worsening pain, swelling, fever.    ED Prescriptions    Medication Sig Dispense Auth. Provider   valACYclovir (VALTREX) 1000 MG tablet Take 1 tablet (1,000 mg total) by mouth 3 (three) times daily. 21 tablet Hall-Potvin, [place], PA-C     PDMP not reviewed this encounter.   Hall-Potvin, [place], PA-C 01/21/20 1257    Hall-Potvin, [place], [place] 01/21/20 1258

## 2020-03-21 DIAGNOSIS — Z0000 Encounter for general adult medical examination without abnormal findings: Secondary | ICD-10-CM | POA: Diagnosis not present

## 2020-03-21 DIAGNOSIS — Z79899 Other long term (current) drug therapy: Secondary | ICD-10-CM | POA: Diagnosis not present

## 2020-03-21 DIAGNOSIS — K589 Irritable bowel syndrome without diarrhea: Secondary | ICD-10-CM | POA: Diagnosis not present

## 2020-03-29 DIAGNOSIS — H18603 Keratoconus, unspecified, bilateral: Secondary | ICD-10-CM | POA: Diagnosis not present

## 2020-03-29 DIAGNOSIS — H52223 Regular astigmatism, bilateral: Secondary | ICD-10-CM | POA: Diagnosis not present

## 2020-03-29 DIAGNOSIS — H259 Unspecified age-related cataract: Secondary | ICD-10-CM | POA: Diagnosis not present

## 2020-05-03 DIAGNOSIS — N83209 Unspecified ovarian cyst, unspecified side: Secondary | ICD-10-CM | POA: Insufficient documentation

## 2020-05-05 DIAGNOSIS — E785 Hyperlipidemia, unspecified: Secondary | ICD-10-CM | POA: Insufficient documentation

## 2020-05-06 DIAGNOSIS — Z1231 Encounter for screening mammogram for malignant neoplasm of breast: Secondary | ICD-10-CM | POA: Diagnosis not present

## 2020-05-06 DIAGNOSIS — E876 Hypokalemia: Secondary | ICD-10-CM | POA: Insufficient documentation

## 2020-05-06 DIAGNOSIS — Z124 Encounter for screening for malignant neoplasm of cervix: Secondary | ICD-10-CM | POA: Diagnosis not present

## 2020-05-06 DIAGNOSIS — M816 Localized osteoporosis [Lequesne]: Secondary | ICD-10-CM | POA: Diagnosis not present

## 2020-05-06 DIAGNOSIS — M810 Age-related osteoporosis without current pathological fracture: Secondary | ICD-10-CM | POA: Diagnosis not present

## 2020-05-06 DIAGNOSIS — N3945 Continuous leakage: Secondary | ICD-10-CM | POA: Diagnosis not present

## 2020-05-06 DIAGNOSIS — Z6826 Body mass index (BMI) 26.0-26.9, adult: Secondary | ICD-10-CM | POA: Diagnosis not present

## 2020-05-06 DIAGNOSIS — N958 Other specified menopausal and perimenopausal disorders: Secondary | ICD-10-CM | POA: Diagnosis not present

## 2020-05-11 ENCOUNTER — Other Ambulatory Visit: Payer: Self-pay | Admitting: Obstetrics and Gynecology

## 2020-05-11 DIAGNOSIS — R928 Other abnormal and inconclusive findings on diagnostic imaging of breast: Secondary | ICD-10-CM

## 2020-05-20 ENCOUNTER — Ambulatory Visit: Payer: Medicare HMO

## 2020-05-20 ENCOUNTER — Other Ambulatory Visit: Payer: Self-pay

## 2020-05-20 ENCOUNTER — Ambulatory Visit
Admission: RE | Admit: 2020-05-20 | Discharge: 2020-05-20 | Disposition: A | Discharge status: Home / Self Care | Payer: Medicare HMO | Source: Ambulatory Visit | Attending: Obstetrics and Gynecology | Admitting: Obstetrics and Gynecology

## 2020-05-20 DIAGNOSIS — R922 Inconclusive mammogram: Secondary | ICD-10-CM | POA: Diagnosis not present

## 2020-05-20 DIAGNOSIS — R928 Other abnormal and inconclusive findings on diagnostic imaging of breast: Secondary | ICD-10-CM

## 2020-05-23 DIAGNOSIS — M810 Age-related osteoporosis without current pathological fracture: Secondary | ICD-10-CM | POA: Diagnosis not present

## 2020-05-23 DIAGNOSIS — M818 Other osteoporosis without current pathological fracture: Secondary | ICD-10-CM | POA: Diagnosis not present

## 2020-05-23 DIAGNOSIS — R32 Unspecified urinary incontinence: Secondary | ICD-10-CM | POA: Diagnosis not present

## 2020-06-07 DIAGNOSIS — R635 Abnormal weight gain: Secondary | ICD-10-CM | POA: Diagnosis not present

## 2020-06-07 DIAGNOSIS — M545 Low back pain: Secondary | ICD-10-CM | POA: Diagnosis not present

## 2020-06-07 DIAGNOSIS — K219 Gastro-esophageal reflux disease without esophagitis: Secondary | ICD-10-CM | POA: Diagnosis not present

## 2020-06-07 DIAGNOSIS — K5909 Other constipation: Secondary | ICD-10-CM | POA: Diagnosis not present

## 2020-06-07 DIAGNOSIS — Z1331 Encounter for screening for depression: Secondary | ICD-10-CM | POA: Diagnosis not present

## 2020-06-07 DIAGNOSIS — N393 Stress incontinence (female) (male): Secondary | ICD-10-CM | POA: Diagnosis not present

## 2020-06-07 DIAGNOSIS — Z0000 Encounter for general adult medical examination without abnormal findings: Secondary | ICD-10-CM | POA: Diagnosis not present

## 2020-06-07 DIAGNOSIS — F418 Other specified anxiety disorders: Secondary | ICD-10-CM | POA: Diagnosis not present

## 2020-06-07 DIAGNOSIS — M6281 Muscle weakness (generalized): Secondary | ICD-10-CM | POA: Diagnosis not present

## 2020-06-23 DIAGNOSIS — M545 Low back pain: Secondary | ICD-10-CM | POA: Diagnosis not present

## 2020-06-23 DIAGNOSIS — M6281 Muscle weakness (generalized): Secondary | ICD-10-CM | POA: Diagnosis not present

## 2020-06-23 DIAGNOSIS — N393 Stress incontinence (female) (male): Secondary | ICD-10-CM | POA: Diagnosis not present

## 2020-06-30 DIAGNOSIS — M545 Low back pain: Secondary | ICD-10-CM | POA: Diagnosis not present

## 2020-06-30 DIAGNOSIS — N393 Stress incontinence (female) (male): Secondary | ICD-10-CM | POA: Diagnosis not present

## 2020-06-30 DIAGNOSIS — M6281 Muscle weakness (generalized): Secondary | ICD-10-CM | POA: Diagnosis not present

## 2020-07-14 DIAGNOSIS — M6281 Muscle weakness (generalized): Secondary | ICD-10-CM | POA: Diagnosis not present

## 2020-07-14 DIAGNOSIS — N393 Stress incontinence (female) (male): Secondary | ICD-10-CM | POA: Diagnosis not present

## 2020-07-14 DIAGNOSIS — M545 Low back pain: Secondary | ICD-10-CM | POA: Diagnosis not present

## 2020-08-08 ENCOUNTER — Ambulatory Visit
Admission: EM | Admit: 2020-08-08 | Discharge: 2020-08-08 | Disposition: A | Discharge status: Home / Self Care | Payer: Medicare HMO | Attending: Family Medicine | Admitting: Family Medicine

## 2020-08-08 ENCOUNTER — Other Ambulatory Visit: Payer: Self-pay

## 2020-08-08 ENCOUNTER — Encounter: Payer: Self-pay | Admitting: Emergency Medicine

## 2020-08-08 DIAGNOSIS — N10 Acute tubulo-interstitial nephritis: Secondary | ICD-10-CM | POA: Diagnosis not present

## 2020-08-08 LAB — POCT URINALYSIS DIP (MANUAL ENTRY)
Bilirubin, UA: NEGATIVE
Glucose, UA: NEGATIVE mg/dL
Ketones, POC UA: NEGATIVE mg/dL
Nitrite, UA: POSITIVE — AB
Protein Ur, POC: 30 mg/dL — AB
Spec Grav, UA: >=1.03 — AB (ref 1.010–1.025)
Urobilinogen, UA: 0.2 E.U./dL
pH, UA: 5.5 (ref 5.0–8.0)

## 2020-08-08 MED ORDER — NITROFURANTOIN MONOHYD MACRO 100 MG PO CAPS: 100.0000 mg | ORAL_CAPSULE | Freq: Two times a day (BID) | ORAL | 0 refills | Status: DC

## 2020-08-08 NOTE — ED Triage Notes (Signed)
Noticed symptoms of a uti one week ago.  Complains of frequent urination, pressure in lower abdomen, and noticed odor

## 2020-08-08 NOTE — ED Provider Notes (Signed)
[facility]    CSN: [id number] Arrival date & time: 08/08/20  5573      History   Chief Complaint No chief complaint on file.   HPI Alicia Dominguez is a 69 y.o. female.   HPI  Alicia Dominguez is a 69 y.o. female presents for evaluation of urinary frequency, urgency and dysuria x 2 days, without flank pain, fever, chills, or abnormal vaginal discharge. Previous history of UTI and reports failure with  antibiotic. Uncertain of prior urine pathology. No LMP recorded. Patient is postmenopausal.  Past Medical History:  Diagnosis Date  . Anxiety     There are no problems to display for this patient.   Past Surgical History:  Procedure Laterality Date  . DILATION AND CURETTAGE OF UTERUS      OB History   No obstetric history on file.      Home Medications    Prior to Admission medications   Medication Sig Start Date End Date Taking? Authorizing Provider  acetaminophen (TYLENOL) 325 MG tablet Take 650 mg by mouth every 6 (six) hours as needed for fever.    [provider]  esomeprazole (NEXIUM) 20 MG capsule Take 20 mg by mouth daily.    [provider]  FLUoxetine (PROZAC) 20 MG capsule Take 20 mg by mouth daily. 11/01/19   [provider]  loratadine (CLARITIN) 10 MG tablet Take 10 mg by mouth daily.    [provider]  potassium chloride (KLOR-CON) 10 MEQ tablet Take 1 tablet (10 mEq total) by mouth daily for 5 days. 12/24/19 12/29/19  Madilyn Hook A, PA-C  Probiotic Product (PROBIOTIC PO) Take 1 capsule by mouth daily.    [provider]  valACYclovir (VALTREX) 1000 MG tablet Take 1 tablet (1,000 mg total) by mouth 3 (three) times daily. 01/21/20   Hall-Potvin, [place], PA-C    Family History Family History  Problem Relation Age of Onset  . Hypertension Mother   . Alzheimer's disease Mother   . Diabetes Mother   . Lung cancer Father     Social History Social History   Tobacco Use  . Smoking status:  Never Smoker  . Smokeless tobacco: Never Used  Substance Use Topics  . Alcohol use: No  . Drug use: No     Allergies   Codeine and Septra [sulfamethoxazole-trimethoprim]   Review of Systems Review of Systems Pertinent negatives listed in HPI  Physical Exam Triage Vital Signs ED Triage Vitals  Enc Vitals Group     BP      Pulse      Resp      Temp      Temp src      SpO2      Weight      Height      Head Circumference      Peak Flow      Pain Score      Pain Loc      Pain Edu?      Excl. in [place]?    No data found.  Updated Vital Signs There were no vitals taken for this visit.  Visual Acuity Right Eye Distance:   Left Eye Distance:   Bilateral Distance:    Right Eye Near:   Left Eye Near:    Bilateral Near:     Physical Exam General appearance: alert, well developed, well nourished, cooperative and in no distress Head: Normocephalic, without obvious abnormality, atraumatic Respiratory: Respirations even and unlabored, normal respiratory rate Heart:  rate and rhythm normal. No gallop or murmurs noted on exam  Abdomen: BS +, no distention, no rebound tenderness, or no mass Negative for CVA tenderness. Extremities: No gross deformities Skin: Skin color, texture, turgor normal. No rashes seen  Psych: Appropriate mood and affect. Neurologic: Mental status: Alert, oriented to person, place, and time, thought content appropriate.   UC Treatments / Results  Labs (all labs ordered are listed, but only abnormal results are displayed) Labs Reviewed  URINE CULTURE  POCT URINALYSIS DIP (MANUAL ENTRY)    EKG   Radiology No results found.  Procedures Procedures (including critical care time)  Medications Ordered in UC Medications - No data to display  Initial Impression / Assessment and Plan / UC Course  I have reviewed the triage vital signs and the nursing notes.  Pertinent labs & imaging results that were available during my care of the patient  were reviewed by me and considered in my medical decision making (see chart for details).    UA confirms nitrites along with symptoms and low-grade fever and slightly tachycardic consistent with likely acute pyelonephritis.  Red flags discussed. Start Macrobid 100 mg twice daily x10 days.  Urine culture pending. Final Clinical Impressions(s) / UC Diagnoses   Final diagnoses:  Acute pyelonephritis   Discharge Instructions   None    ED Prescriptions    Medication Sig Dispense Auth. Provider   nitrofurantoin, macrocrystal-monohydrate, (MACROBID) 100 MG capsule Take 1 capsule (100 mg total) by mouth 2 (two) times daily. 20 capsule Scot Jun, FNP     PDMP not reviewed this encounter.   Scot Jun, [place] 08/13/20 [phone]

## 2020-08-11 LAB — URINE CULTURE: Culture: >=100000 — AB

## 2020-09-17 ENCOUNTER — Ambulatory Visit: Payer: Medicare HMO | Attending: Internal Medicine

## 2020-09-17 DIAGNOSIS — Z23 Encounter for immunization: Secondary | ICD-10-CM

## 2020-09-17 NOTE — Progress Notes (Signed)
   Covid-19 Vaccination Clinic  Name:  Alicia Dominguez    MRN: [id number] DOB: 01/07/1951  09/17/2020  Alicia Dominguez was observed post Covid-19 immunization for 15 minutes without incident. She was provided with Vaccine Information Sheet and instruction to access the V-Safe system.   Alicia Dominguez was instructed to call 911 with any severe reactions post vaccine: Marland Kitchen Difficulty breathing  . Swelling of face and throat  . A fast heartbeat  . A bad rash all over body  . Dizziness and weakness

## 2021-03-10 IMAGING — MG MM DIGITAL DIAGNOSTIC UNILAT*L* W/ TOMO W/ CAD
6 series · 6 of 18 positions shown · non-contrast
Comparison: Previous exam(s).

CLINICAL DATA: 69-year-old female recalled from screening mammogram
dated 05/06/2020 for possible left breast distortion.

EXAM:
DIGITAL DIAGNOSTIC UNILATERAL LEFT MAMMOGRAM WITH TOMO AND CAD

[L MLO synth-2D (1 of 2)]
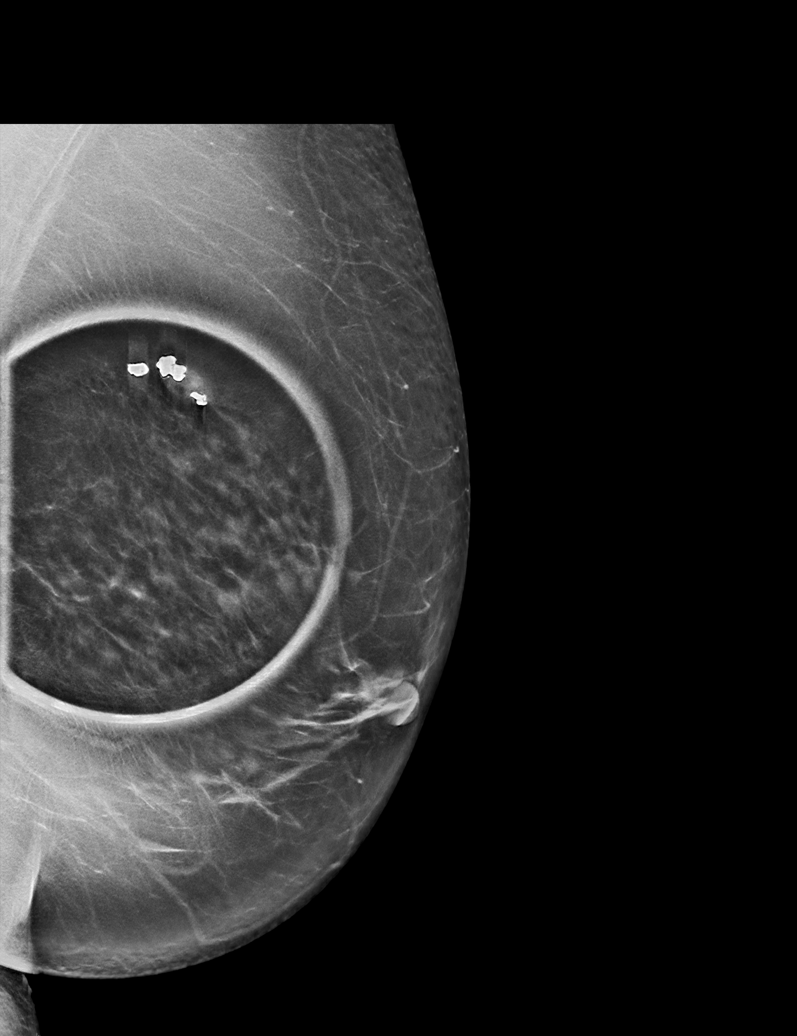

[L MLO synth-2D (2 of 2)]
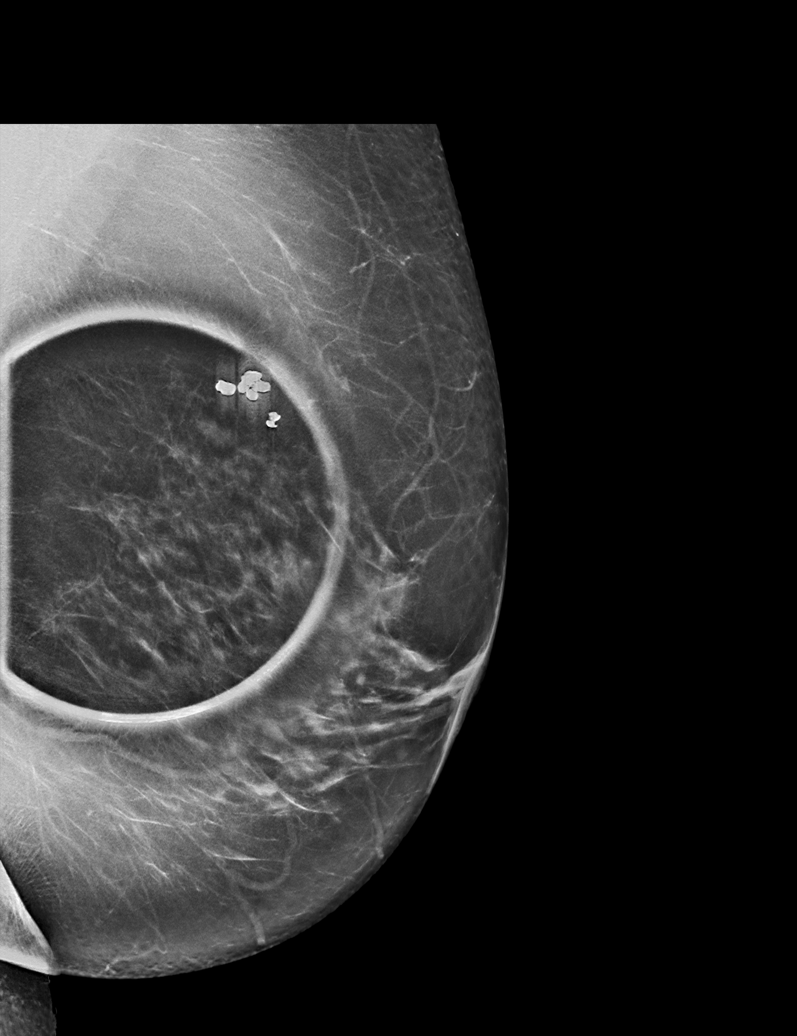

[L ML synth-2D]
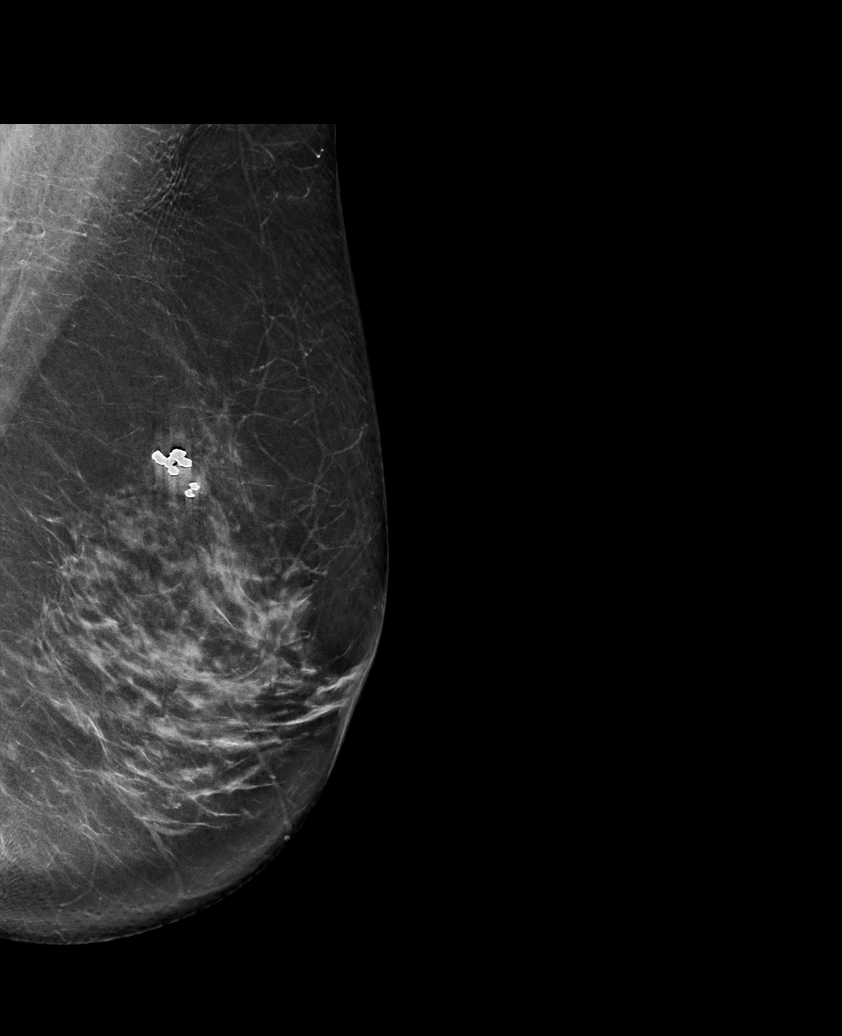

[L ML tomo · tomo slice 43/86.0]
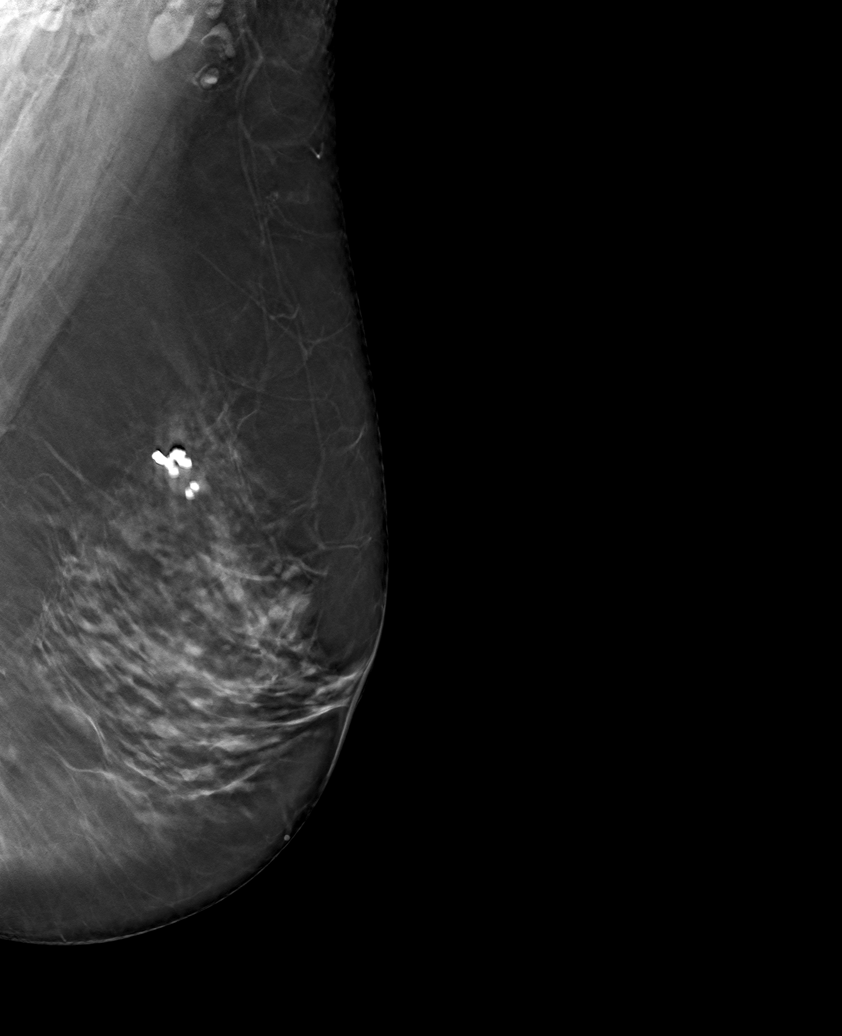

[L MLO tomo (1 of 2) · tomo slice 39/77.0]
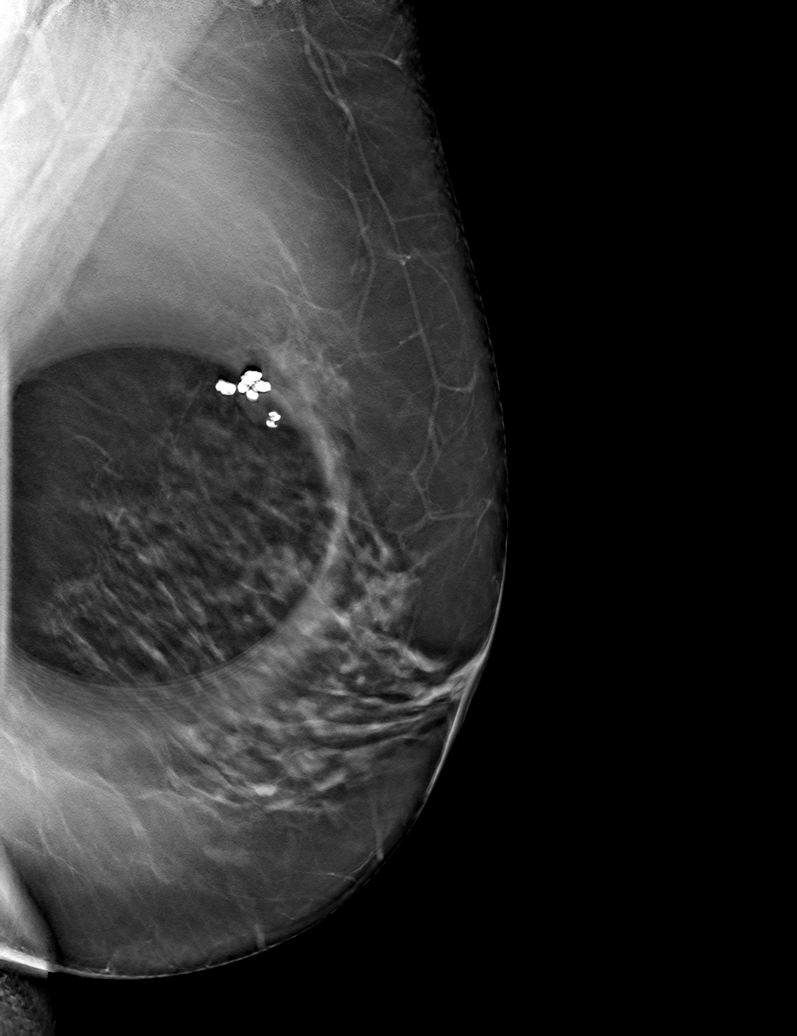

[L MLO tomo (2 of 2) · tomo slice 40/79.0]
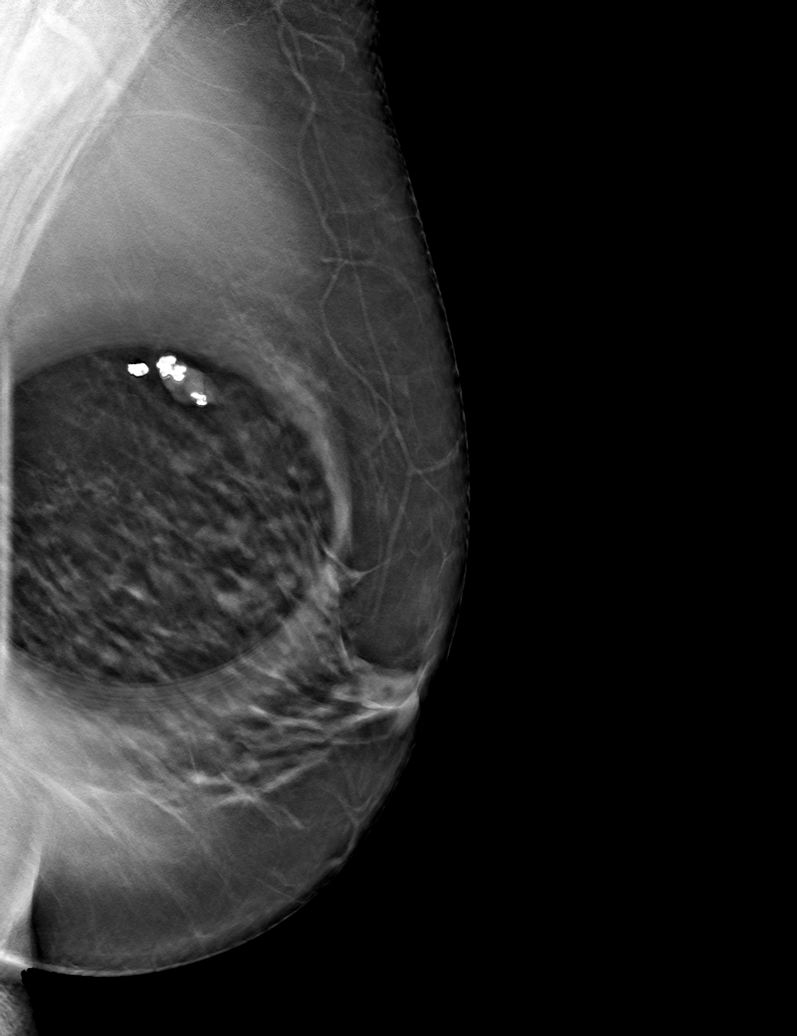

[6 of 18 positions shown; findings below may reference images not displayed]

ACR Breast Density Category c: The breast tissue is heterogeneously
dense, which may obscure small masses.
FINDINGS: Previously described, possible distortion in the posterior left
breast has seen on the MLO projection resolves into well dispersed
fibroglandular tissue on today's additional views. No suspicious
findings are identified.

Mammographic images were processed with CAD.
IMPRESSION: No mammographic evidence of malignancy.

RECOMMENDATION:
Screening mammogram in one year.(Code:SW-[id number])

I have discussed the findings and recommendations with the patient.
If applicable, a reminder letter will be sent to the patient
regarding the next appointment.

BI-RADS CATEGORY  1: Negative.

## 2021-04-04 ENCOUNTER — Ambulatory Visit
Admission: EM | Admit: 2021-04-04 | Discharge: 2021-04-04 | Disposition: A | Discharge status: Home / Self Care | Payer: Medicare HMO | Attending: Emergency Medicine | Admitting: Emergency Medicine

## 2021-04-04 ENCOUNTER — Other Ambulatory Visit: Payer: Self-pay

## 2021-04-04 DIAGNOSIS — R21 Rash and other nonspecific skin eruption: Secondary | ICD-10-CM | POA: Diagnosis not present

## 2021-04-04 MED ORDER — TRIAMCINOLONE ACETONIDE 0.1 % EX CREA: 1.0000 "application " | TOPICAL_CREAM | Freq: Two times a day (BID) | CUTANEOUS | 0 refills | Status: DC

## 2021-04-04 MED ORDER — CEPHALEXIN 500 MG PO CAPS: 500.0000 mg | ORAL_CAPSULE | Freq: Four times a day (QID) | ORAL | 0 refills | Status: DC

## 2021-04-04 MED ORDER — PREDNISONE 20 MG PO TABS: 20.0000 mg | ORAL_TABLET | Freq: Every day | ORAL | 0 refills | Status: AC

## 2021-04-04 MED ORDER — MUPIROCIN 2 % EX OINT: 1.0000 "application " | TOPICAL_OINTMENT | Freq: Two times a day (BID) | CUTANEOUS | 0 refills | Status: DC

## 2021-04-04 NOTE — Discharge Instructions (Signed)
Prednisone daily for 5 days Triamcinolone twice daily to areas to help with localized swelling inflammation and itching Bactroban twice daily to area burn area still prevent infection May fill prescription for Keflex if developing any increased redness swelling pain or redness to any lesions Return if not improving or worsening

## 2021-04-04 NOTE — ED Provider Notes (Signed)
[facility]    CSN: [id number] Arrival date & time: 04/04/21  1550      History   Chief Complaint Chief Complaint  Patient presents with  . Insect Bite  . Rash    HPI Alicia Dominguez is a 70 y.o. female presenting today for evaluation of rash to chest and insect bite to forearm.  Reports last Friday believes she was stung by a wasp to her left forearm.  She has had associated itching and redness around the area since.  Denies significant pain.  Overall redness has remained slightly stable, slightly increased.  Denies fevers.  Has also broke out to rash to chest for approximately 1 week with associated itching as well.  Unclear trigger, possible perfume lotions or jewelry.  Denies rash elsewhere.  HPI  Past Medical History:  Diagnosis Date  . Anxiety     There are no problems to display for this patient.   Past Surgical History:  Procedure Laterality Date  . DILATION AND CURETTAGE OF UTERUS      OB History   No obstetric history on file.      Home Medications    Prior to Admission medications   Medication Sig Start Date End Date Taking? Authorizing Provider  cephALEXin (KEFLEX) 500 MG capsule Take 1 capsule (500 mg total) by mouth 4 (four) times daily. 04/04/21  Yes Jehan Ranganathan C, PA-C  mupirocin ointment (BACTROBAN) 2 % Apply 1 application topically 2 (two) times daily. 04/04/21  Yes Caydin Yeatts C, PA-C  predniSONE (DELTASONE) 20 MG tablet Take 1 tablet (20 mg total) by mouth daily with breakfast for 5 days. 04/04/21 04/09/21 Yes Allyson Tineo C, PA-C  triamcinolone cream (KENALOG) 0.1 % Apply 1 application topically 2 (two) times daily. 04/04/21  Yes Meet Weathington C, PA-C  acetaminophen (TYLENOL) 325 MG tablet Take 650 mg by mouth every 6 (six) hours as needed for fever.    [provider]  esomeprazole (NEXIUM) 20 MG capsule Take 20 mg by mouth daily.    [provider]  fexofenadine (ALLEGRA) 60 MG tablet Take 60 mg by mouth 2  (two) times daily.    [provider]  FLUoxetine (PROZAC) 20 MG capsule Take 20 mg by mouth daily. 11/01/19   [provider]  loratadine (CLARITIN) 10 MG tablet Take 10 mg by mouth daily.    [provider]  Probiotic Product (PROBIOTIC PO) Take 1 capsule by mouth daily.    [provider]    Family History Family History  Problem Relation Age of Onset  . Hypertension Mother   . Alzheimer's disease Mother   . Diabetes Mother   . Lung cancer Father     Social History Social History   Tobacco Use  . Smoking status: Never Smoker  . Smokeless tobacco: Never Used  Substance Use Topics  . Alcohol use: No  . Drug use: No     Allergies   Codeine and Septra [sulfamethoxazole-trimethoprim]   Review of Systems Review of Systems  Constitutional: Negative for fatigue and fever.  HENT: Negative for mouth sores.   Eyes: Negative for visual disturbance.  Respiratory: Negative for shortness of breath.   Cardiovascular: Negative for chest pain.  Gastrointestinal: Negative for abdominal pain, nausea and vomiting.  Musculoskeletal: Negative for arthralgias and joint swelling.  Skin: Positive for color change and rash. Negative for wound.  Neurological: Negative for dizziness, weakness, light-headedness and headaches.     Physical Exam Triage Vital Signs ED Triage  Vitals [04/04/21 1719]  Enc Vitals Group     BP (!) 160/81     Pulse Rate 74     Resp 18     Temp 98.1 F (36.7 C)     Temp Source Oral     SpO2 96 %     Weight      Height      Head Circumference      Peak Flow      Pain Score 0     Pain Loc      Pain Edu?      Excl. in [place]?    No data found.  Updated Vital Signs BP (!) 160/81 (BP Location: Left Arm)   Pulse 74   Temp 98.1 F (36.7 C) (Oral)   Resp 18   SpO2 96%   Visual Acuity Right Eye Distance:   Left Eye Distance:   Bilateral Distance:    Right Eye Near:   Left Eye Near:    Bilateral Near:     Physical  Exam Vitals and nursing note reviewed.  Constitutional:      Appearance: She is well-developed.     Comments: No acute distress  HENT:     Head: Normocephalic and atraumatic.     Nose: Nose normal.  Eyes:     Conjunctiva/sclera: Conjunctivae normal.  Cardiovascular:     Rate and Rhythm: Normal rate.  Pulmonary:     Effort: Pulmonary effort is normal. No respiratory distress.  Abdominal:     General: There is no distension.  Musculoskeletal:        General: Normal range of motion.     Cervical back: Neck supple.  Skin:    General: Skin is warm and dry.     Comments: Left forearm with approximately 2 cm x 2 cm area of erythema with central scabbing, no induration or streaking of erythema  Anterior chest with linear areas of erythema across chest bilaterally with signs of excoriation, no surrounding erythema or warmth  Neurological:     Mental Status: She is alert and oriented to person, place, and time.      UC Treatments / Results  Labs (all labs ordered are listed, but only abnormal results are displayed) Labs Reviewed - No data to display  EKG   Radiology No results found.  Procedures Procedures (including critical care time)  Medications Ordered in UC Medications - No data to display  Initial Impression / Assessment and Plan / UC Course  I have reviewed the triage vital signs and the nursing notes.  Pertinent labs & imaging results that were available during my care of the patient were reviewed by me and considered in my medical decision making (see chart for details).     Patient on prednisone course x5 days given symptoms on chest not resolving as well as patient's reported significant itching, triamcinolone topically, Bactroban topically as well to help prevent infection, does not appear infectious to either areas at this time, but will provide Keflex prescription if developing any increased redness pain or swelling to areas.  Discussed strict return  precautions. Patient verbalized understanding and is agreeable with plan.  Final Clinical Impressions(s) / UC Diagnoses   Final diagnoses:  Rash and nonspecific skin eruption     Discharge Instructions     Prednisone daily for 5 days Triamcinolone twice daily to areas to help with localized swelling inflammation and itching Bactroban twice daily to area burn area still prevent infection May fill prescription  for Keflex if developing any increased redness swelling pain or redness to any lesions Return if not improving or worsening   ED Prescriptions    Medication Sig Dispense Auth. Provider   predniSONE (DELTASONE) 20 MG tablet Take 1 tablet (20 mg total) by mouth daily with breakfast for 5 days. 5 tablet Renu Asby C, PA-C   triamcinolone cream (KENALOG) 0.1 % Apply 1 application topically 2 (two) times daily. 80 g Adalind Weitz C, PA-C   mupirocin ointment (BACTROBAN) 2 % Apply 1 application topically 2 (two) times daily. 30 g Lavanna Rog C, PA-C   cephALEXin (KEFLEX) 500 MG capsule Take 1 capsule (500 mg total) by mouth 4 (four) times daily. 20 capsule Alyzabeth Pontillo, [place] C, PA-C     PDMP not reviewed this encounter.   Janith Lima, [place] 04/04/21 [phone]

## 2021-04-04 NOTE — ED Triage Notes (Signed)
Pt c/o insect bite to lt anterior forearm since Friday. C/o redness and itching. Pt c/o rash to upper chest x1wk from perfume or lotions.

## 2021-04-21 DIAGNOSIS — Z23 Encounter for immunization: Secondary | ICD-10-CM | POA: Diagnosis not present

## 2021-04-24 ENCOUNTER — Ambulatory Visit
Admission: EM | Admit: 2021-04-24 | Discharge: 2021-04-24 | Disposition: A | Discharge status: Home / Self Care | Payer: Medicare HMO | Attending: Emergency Medicine | Admitting: Emergency Medicine

## 2021-04-24 ENCOUNTER — Other Ambulatory Visit: Payer: Self-pay

## 2021-04-24 DIAGNOSIS — M791 Myalgia, unspecified site: Secondary | ICD-10-CM

## 2021-04-24 DIAGNOSIS — R059 Cough, unspecified: Secondary | ICD-10-CM | POA: Diagnosis not present

## 2021-04-24 DIAGNOSIS — Z20822 Contact with and (suspected) exposure to covid-19: Secondary | ICD-10-CM | POA: Diagnosis not present

## 2021-04-24 MED ORDER — BENZONATATE 100 MG PO CAPS: 100.0000 mg | ORAL_CAPSULE | Freq: Three times a day (TID) | ORAL | 0 refills | Status: AC | PRN

## 2021-04-24 MED ORDER — FLUTICASONE PROPIONATE 50 MCG/ACT NA SUSP: 1.0000 | Freq: Every day | NASAL | 2 refills | Status: DC

## 2021-04-24 NOTE — ED Triage Notes (Signed)
Pt c/o cough, nasal congestion, headache, sore throat, and low grade temp since Saturday. States received her covid booster on Friday.

## 2021-04-24 NOTE — ED Provider Notes (Signed)
[place]  ____________________________________________  Time seen: Approximately 5:26 PM  I have reviewed the triage vital signs and the nursing notes.   HISTORY  Chief Complaint Cough   Historian Patient     HPI Alicia Dominguez is a 70 y.o. female presents to the urgent care with nasal congestion, cough, low-grade fever, pharyngitis and body aches since receiving her COVID booster on Friday.  Patient denies chest pain, chest tightness or shortness of breath.  She has had diarrhea but no vomiting.  No new rash.  No recent travel.  Patient states that prior to onset of her symptoms, she was well but no other viral URI-like symptoms.   Past Medical History:  Diagnosis Date  . Anxiety      Immunizations up to date:  Yes.     Past Medical History:  Diagnosis Date  . Anxiety     There are no problems to display for this patient.   Past Surgical History:  Procedure Laterality Date  . DILATION AND CURETTAGE OF UTERUS      Prior to Admission medications   Medication Sig Start Date End Date Taking? Authorizing Provider  benzonatate (TESSALON PERLES) 100 MG capsule Take 1 capsule (100 mg total) by mouth 3 (three) times daily as needed for up to 7 days for cough. 04/24/21 05/01/21 Yes Vallarie Mare M, PA-C  fluticasone (FLONASE) 50 MCG/ACT nasal spray Place 1 spray into both nostrils daily for 7 days. 04/24/21 05/01/21 Yes Vallarie Mare M, PA-C  acetaminophen (TYLENOL) 325 MG tablet Take 650 mg by mouth every 6 (six) hours as needed for fever.    [provider]  cephALEXin (KEFLEX) 500 MG capsule Take 1 capsule (500 mg total) by mouth 4 (four) times daily. 04/04/21   Wieters, Hallie C, PA-C  esomeprazole (NEXIUM) 20 MG capsule Take 20 mg by mouth daily.    [provider]  fexofenadine (ALLEGRA) 60 MG tablet Take 60 mg by mouth 2 (two) times daily.    [provider]  FLUoxetine (PROZAC) 20 MG capsule Take 20 mg by mouth daily.  11/01/19   [provider]  loratadine (CLARITIN) 10 MG tablet Take 10 mg by mouth daily.    [provider]  mupirocin ointment (BACTROBAN) 2 % Apply 1 application topically 2 (two) times daily. 04/04/21   Wieters, Hallie C, PA-C  Probiotic Product (PROBIOTIC PO) Take 1 capsule by mouth daily.    [provider]  triamcinolone cream (KENALOG) 0.1 % Apply 1 application topically 2 (two) times daily. 04/04/21   Wieters, Hallie C, PA-C    Allergies Codeine and Septra [sulfamethoxazole-trimethoprim]  Family History  Problem Relation Age of Onset  . Hypertension Mother   . Alzheimer's disease Mother   . Diabetes Mother   . Lung cancer Father     Social History Social History   Tobacco Use  . Smoking status: Never Smoker  . Smokeless tobacco: Never Used  Substance Use Topics  . Alcohol use: No  . Drug use: No      Review of Systems  Constitutional: Patient has fever.  Eyes: No visual changes. No discharge ENT: Patient has congestion.  Cardiovascular: no chest pain. Respiratory: Patient has cough.  Gastrointestinal: No abdominal pain.  No nausea, no vomiting. Patient had diarrhea.  Genitourinary: Negative for dysuria. No hematuria Musculoskeletal: Patient has myalgias.  Skin: Negative for rash, abrasions, lacerations, ecchymosis. Neurological: Patient has headache, no focal weakness or numbness.     ____________________________________________   PHYSICAL  EXAM:  VITAL SIGNS: ED Triage Vitals  Enc Vitals Group     BP 04/24/21 1714 129/78     Pulse Rate 04/24/21 1714 (!) 119     Resp 04/24/21 1714 18     Temp 04/24/21 1714 98.6 F (37 C)     Temp Source 04/24/21 1714 Oral     SpO2 04/24/21 1714 95 %     Weight --      Height --      Head Circumference --      Peak Flow --      Pain Score 04/24/21 1715 4     Pain Loc --      Pain Edu? --      Excl. in [place]? --      Constitutional: Alert and oriented. Patient is lying supine. Eyes:  Conjunctivae are normal. PERRL. EOMI. Head: Atraumatic. ENT:      Ears: Tympanic membranes are mildly injected with mild effusion bilaterally.       Nose: No congestion/rhinnorhea.      Mouth/Throat: Mucous membranes are moist. Posterior pharynx is mildly erythematous.  Hematological/Lymphatic/Immunilogical: No cervical lymphadenopathy.  Cardiovascular: Normal rate, regular rhythm. Normal S1 and S2.  Good peripheral circulation. Respiratory: Normal respiratory effort without tachypnea or retractions. Lungs CTAB. Good air entry to the bases with no decreased or absent breath sounds. Gastrointestinal: Bowel sounds 4 quadrants. Soft and nontender to palpation. No guarding or rigidity. No palpable masses. No distention. No CVA tenderness. Musculoskeletal: Full range of motion to all extremities. No gross deformities appreciated. Neurologic:  Normal speech and language. No gross focal neurologic deficits are appreciated.  Skin:  Skin is warm, dry and intact. No rash noted. Psychiatric: Mood and affect are normal. Speech and behavior are normal. Patient exhibits appropriate insight and judgement.   ____________________________________________   LABS (all labs ordered are listed, but only abnormal results are displayed)  Labs Reviewed  COVID-19, FLU A+B NAA   ____________________________________________  EKG   ____________________________________________  RADIOLOGY   No results found.  ____________________________________________    PROCEDURES  Procedure(s) performed:     Procedures     Medications - No data to display   ____________________________________________   INITIAL IMPRESSION / ASSESSMENT AND PLAN / ED COURSE  Pertinent labs & imaging results that were available during my care of the patient were reviewed by me and considered in my medical decision making (see chart for details).      Assessment and Plan:  Viral URI-like illness 70 year old female  presents to the urgent care with cough, nasal congestion, headache, pharyngitis and low-grade fever that started on Saturday.  Patient was tachycardic at triage but vital signs were otherwise reassuring.  She had no increased work of breathing and no adventitious lung sounds were auscultated.  Will obtain sendoff COVID-19 and influenza testing and will reassess.  Heart rate improved prior to discharge.  Recommended increase hydration at home.  Patient education regarding checking COVID-19 and influenza testing results was given.  Return precautions were given to return with new or worsening symptoms   ____________________________________________  FINAL CLINICAL IMPRESSION(S) / ED DIAGNOSES  Final diagnoses:  Cough  Myalgia      NEW MEDICATIONS STARTED DURING THIS VISIT:  ED Discharge Orders         Ordered    fluticasone (FLONASE) 50 MCG/ACT nasal spray  Daily        04/24/21 1737    benzonatate (TESSALON PERLES) 100 MG capsule  3 times daily PRN  04/24/21 1739              This chart was dictated using voice recognition software/Dragon. Despite best efforts to proofread, errors can occur which can change the meaning. Any change was purely unintentional.     Lannie Fields, PA-C 04/24/21 1747

## 2021-04-24 NOTE — Discharge Instructions (Signed)
You can take 2 Tessalon Perles before bed. You can do 1 spray of Flonase each side for 7 days.

## 2021-04-25 LAB — COVID-19, FLU A+B NAA
Influenza A, NAA: NOT DETECTED
Influenza B, NAA: NOT DETECTED
SARS-CoV-2, NAA: NOT DETECTED

## 2021-06-16 DIAGNOSIS — Z0000 Encounter for general adult medical examination without abnormal findings: Secondary | ICD-10-CM | POA: Diagnosis not present

## 2021-06-16 DIAGNOSIS — R634 Abnormal weight loss: Secondary | ICD-10-CM | POA: Diagnosis not present

## 2021-06-22 DIAGNOSIS — Z0000 Encounter for general adult medical examination without abnormal findings: Secondary | ICD-10-CM | POA: Diagnosis not present

## 2021-06-22 DIAGNOSIS — R030 Elevated blood-pressure reading, without diagnosis of hypertension: Secondary | ICD-10-CM | POA: Diagnosis not present

## 2021-06-22 DIAGNOSIS — R82998 Other abnormal findings in urine: Secondary | ICD-10-CM | POA: Diagnosis not present

## 2021-06-22 DIAGNOSIS — Z1339 Encounter for screening examination for other mental health and behavioral disorders: Secondary | ICD-10-CM | POA: Diagnosis not present

## 2021-06-22 DIAGNOSIS — F5104 Psychophysiologic insomnia: Secondary | ICD-10-CM | POA: Diagnosis not present

## 2021-06-22 DIAGNOSIS — H9193 Unspecified hearing loss, bilateral: Secondary | ICD-10-CM | POA: Diagnosis not present

## 2021-06-22 DIAGNOSIS — J302 Other seasonal allergic rhinitis: Secondary | ICD-10-CM | POA: Diagnosis not present

## 2021-06-22 DIAGNOSIS — Z1331 Encounter for screening for depression: Secondary | ICD-10-CM | POA: Diagnosis not present

## 2021-06-22 DIAGNOSIS — K219 Gastro-esophageal reflux disease without esophagitis: Secondary | ICD-10-CM | POA: Diagnosis not present

## 2021-10-04 DIAGNOSIS — Z23 Encounter for immunization: Secondary | ICD-10-CM | POA: Diagnosis not present

## 2021-10-21 ENCOUNTER — Ambulatory Visit
Admission: EM | Admit: 2021-10-21 | Discharge: 2021-10-21 | Disposition: A | Discharge status: Home / Self Care | Payer: Medicare HMO | Attending: Internal Medicine | Admitting: Internal Medicine

## 2021-10-21 ENCOUNTER — Encounter: Payer: Self-pay | Admitting: Emergency Medicine

## 2021-10-21 ENCOUNTER — Other Ambulatory Visit: Payer: Self-pay

## 2021-10-21 DIAGNOSIS — Z20822 Contact with and (suspected) exposure to covid-19: Secondary | ICD-10-CM | POA: Diagnosis not present

## 2021-10-21 DIAGNOSIS — R6889 Other general symptoms and signs: Secondary | ICD-10-CM

## 2021-10-21 DIAGNOSIS — J069 Acute upper respiratory infection, unspecified: Secondary | ICD-10-CM | POA: Diagnosis not present

## 2021-10-21 MED ORDER — GUAIFENESIN 200 MG PO TABS: 200.0000 mg | ORAL_TABLET | ORAL | 0 refills | Status: DC | PRN

## 2021-10-21 MED ORDER — FLUTICASONE PROPIONATE 50 MCG/ACT NA SUSP: 1.0000 | Freq: Every day | NASAL | 0 refills | Status: DC

## 2021-10-21 NOTE — ED Provider Notes (Addendum)
[facility]    CSN: [id number] Arrival date & time: 10/21/21  0901      History   Chief Complaint Chief Complaint  Patient presents with   Cough    HPI Alicia Dominguez is a 70 y.o. female.   Patient presents with cough, nasal congestion, body aches that has been present for approximately 5 days.  Cough is nonproductive and dry per patient.  Patient has been taking Tessalon Perles that were previously prescribed for a different acute illness that have not shown any improvement.  Denies sore throat, nausea, vomiting, diarrhea but does endorse some ear discomfort bilaterally.  Denies any known sick contacts.  Denies any known fevers at home.  Denies chest pain, shortness of breath.   Cough  Past Medical History:  Diagnosis Date   Anxiety     There are no problems to display for this patient.   Past Surgical History:  Procedure Laterality Date   DILATION AND CURETTAGE OF UTERUS      OB History   No obstetric history on file.      Home Medications    Prior to Admission medications   Medication Sig Start Date End Date Taking? Authorizing Provider  fluticasone (FLONASE) 50 MCG/ACT nasal spray Place 1 spray into both nostrils daily for 3 days. 10/21/21 10/24/21 Yes Dona Klemann, Michele Rockers, FNP  guaiFENesin 200 MG tablet Take 1 tablet (200 mg total) by mouth every 4 (four) hours as needed for cough or to loosen phlegm. 10/21/21  Yes Bynum Mccullars, Hildred Alamin E, FNP  acetaminophen (TYLENOL) 325 MG tablet Take 650 mg by mouth every 6 (six) hours as needed for fever.    [provider]  cephALEXin (KEFLEX) 500 MG capsule Take 1 capsule (500 mg total) by mouth 4 (four) times daily. 04/04/21   Wieters, Hallie C, PA-C  esomeprazole (NEXIUM) 20 MG capsule Take 20 mg by mouth daily.    [provider]  fexofenadine (ALLEGRA) 60 MG tablet Take 60 mg by mouth 2 (two) times daily.    [provider]  FLUoxetine (PROZAC) 20 MG capsule Take 20 mg by mouth daily.  11/01/19   [provider]  loratadine (CLARITIN) 10 MG tablet Take 10 mg by mouth daily.    [provider]  mupirocin ointment (BACTROBAN) 2 % Apply 1 application topically 2 (two) times daily. 04/04/21   Wieters, Hallie C, PA-C  Probiotic Product (PROBIOTIC PO) Take 1 capsule by mouth daily.    [provider]  triamcinolone cream (KENALOG) 0.1 % Apply 1 application topically 2 (two) times daily. 04/04/21   Wieters, Elesa Hacker, PA-C    Family History Family History  Problem Relation Age of Onset   Hypertension Mother    Alzheimer's disease Mother    Diabetes Mother    Lung cancer Father     Social History Social History   Tobacco Use   Smoking status: Never   Smokeless tobacco: Never  Substance Use Topics   Alcohol use: No   Drug use: No     Allergies   Codeine and Septra [sulfamethoxazole-trimethoprim]   Review of Systems Review of Systems Per HPI  Physical Exam Triage Vital Signs ED Triage Vitals [10/21/21 0944]  Enc Vitals Group     BP (!) 151/76     Pulse Rate 100     Resp 16     Temp 98 F (36.7 C)     Temp Source Oral     SpO2 97 %  Weight      Height      Head Circumference      Peak Flow      Pain Score 5     Pain Loc      Pain Edu?      Excl. in [place]?    No data found.  Updated Vital Signs BP (!) 151/76 (BP Location: Left Arm)   Pulse 100   Temp 98 F (36.7 C) (Oral)   Resp 16   SpO2 97%   Visual Acuity Right Eye Distance:   Left Eye Distance:   Bilateral Distance:    Right Eye Near:   Left Eye Near:    Bilateral Near:     Physical Exam Constitutional:      General: She is not in acute distress.    Appearance: Normal appearance. She is not toxic-appearing or diaphoretic.  HENT:     Head: Normocephalic and atraumatic.     Right Ear: Ear canal normal. A middle ear effusion is present. Tympanic membrane is not perforated, erythematous or bulging.     Left Ear: Ear canal normal. A middle ear effusion is  present. Tympanic membrane is not perforated, erythematous or bulging.     Nose: Congestion present.     Mouth/Throat:     Mouth: Mucous membranes are moist.     Pharynx: No posterior oropharyngeal erythema.  Eyes:     Extraocular Movements: Extraocular movements intact.     Conjunctiva/sclera: Conjunctivae normal.     Pupils: Pupils are equal, round, and reactive to light.  Cardiovascular:     Rate and Rhythm: Normal rate and regular rhythm.     Pulses: Normal pulses.     Heart sounds: Normal heart sounds.  Pulmonary:     Effort: Pulmonary effort is normal. No respiratory distress.     Breath sounds: Normal breath sounds. No stridor. No wheezing, rhonchi or rales.  Abdominal:     General: Abdomen is flat. Bowel sounds are normal.     Palpations: Abdomen is soft.  Musculoskeletal:        General: Normal range of motion.     Cervical back: Normal range of motion.  Skin:    General: Skin is warm and dry.  Neurological:     General: No focal deficit present.     Mental Status: She is alert and oriented to person, place, and time. Mental status is at baseline.  Psychiatric:        Mood and Affect: Mood normal.        Behavior: Behavior normal.     UC Treatments / Results  Labs (all labs ordered are listed, but only abnormal results are displayed) Labs Reviewed  COVID-19, FLU A+B NAA    EKG   Radiology No results found.  Procedures Procedures (including critical care time)  Medications Ordered in UC Medications - No data to display  Initial Impression / Assessment and Plan / UC Course  I have reviewed the triage vital signs and the nursing notes.  Pertinent labs & imaging results that were available during my care of the patient were reviewed by me and considered in my medical decision making (see chart for details).     Patient presents with symptoms likely from a viral upper respiratory infection. Differential includes bacterial pneumonia, sinusitis, allergic  rhinitis, Covid 19, flu. Do not suspect underlying cardiopulmonary process. Symptoms seem unlikely related to ACS, CHF or COPD exacerbations, pneumonia, pneumothorax. Patient is nontoxic appearing and not in  need of emergent medical intervention.  COVID-19, flu swab pending.  Limited on options for symptom control given patient's high blood pressure and age.  Prescribed guaifenesin and Flonase to take condition to her daily Claritin.  Return if symptoms fail to improve in 1-2 weeks or you develop shortness of breath, chest pain, severe headache. Patient states understanding and is agreeable.  Discharged with PCP followup.  Final Clinical Impressions(s) / UC Diagnoses   Final diagnoses:  Viral upper respiratory tract infection with cough  Encounter for laboratory testing for COVID-19 virus  Flu-like symptoms     Discharge Instructions      It appears that you have a viral upper respiratory infection that should resolve in the next few days with symptomatic treatment.  You have been prescribed a cough medication to take as needed.  You may also try Coricidin HBP that you can get over-the-counter as this is safe with high blood pressure and age.  Flonase has also been refilled for you.  Please use this in addition to Claritin to help alleviate fluid in the ears.    ED Prescriptions     Medication Sig Dispense Auth. Provider   guaiFENesin 200 MG tablet Take 1 tablet (200 mg total) by mouth every 4 (four) hours as needed for cough or to loosen phlegm. 30 suppository Jamelle Noy, [place] E, [place]   fluticasone [facility]) 50 MCG/ACT nasal spray Place 1 spray into both nostrils daily for 3 days. 16 g Teodora Medici, [place]      PDMP not reviewed this encounter.   Teodora Medici, [place] 10/21/21 [place], [place], [place] 10/21/21 1004

## 2021-10-21 NOTE — Discharge Instructions (Signed)
It appears that you have a viral upper respiratory infection that should resolve in the next few days with symptomatic treatment.  You have been prescribed a cough medication to take as needed.  You may also try Coricidin HBP that you can get over-the-counter as this is safe with high blood pressure and age.  Flonase has also been refilled for you.  Please use this in addition to Claritin to help alleviate fluid in the ears.

## 2021-10-21 NOTE — ED Triage Notes (Signed)
Coughing since Tuesday. Taking tessalon pearls without improvement. Also c/o headache, body aches. Denies N/V/D. Received flu shot two weeks ago, has not received new covid booster.

## 2021-10-22 LAB — COVID-19, FLU A+B NAA
Influenza A, NAA: NOT DETECTED
Influenza B, NAA: NOT DETECTED
SARS-CoV-2, NAA: DETECTED — AB

## 2021-12-06 DIAGNOSIS — Z0100 Encounter for examination of eyes and vision without abnormal findings: Secondary | ICD-10-CM | POA: Diagnosis not present

## 2021-12-06 DIAGNOSIS — H5210 Myopia, unspecified eye: Secondary | ICD-10-CM | POA: Diagnosis not present

## 2022-03-08 ENCOUNTER — Ambulatory Visit: Payer: Medicare HMO | Admitting: Podiatry

## 2022-03-08 ENCOUNTER — Encounter: Payer: Self-pay | Admitting: Podiatry

## 2022-03-08 DIAGNOSIS — L603 Nail dystrophy: Secondary | ICD-10-CM

## 2022-03-08 DIAGNOSIS — B351 Tinea unguium: Secondary | ICD-10-CM | POA: Diagnosis not present

## 2022-03-10 NOTE — Progress Notes (Signed)
?  Subjective:  ?Patient ID: Alicia Dominguez, female    DOB: 05-19-1951,  MRN: [id number] ?HPI ?Chief Complaint  ?Patient presents with  ? Nail Problem  ?  Toenails bilateral - thick and discolored nails x years, brittle, tried OTC meds-no help  ? New Patient (Initial Visit)  ? ? ?71 y.o. female presents with the above complaint.  ? ?ROS: Denies fever chills nausea vomiting muscle aches pains calf pain back pain chest pain shortness of breath. ? ?Past Medical History:  ?Diagnosis Date  ? Anxiety   ? ?Past Surgical History:  ?Procedure Laterality Date  ? DILATION AND CURETTAGE OF UTERUS    ? ? ?Current Outpatient Medications:  ?  acetaminophen (TYLENOL) 325 MG tablet, Take 650 mg by mouth every 6 (six) hours as needed for fever., Disp: , Rfl:  ?  esomeprazole (NEXIUM) 20 MG capsule, Take 20 mg by mouth daily., Disp: , Rfl:  ?  fexofenadine (ALLEGRA) 60 MG tablet, Take 60 mg by mouth 2 (two) times daily., Disp: , Rfl:  ?  FLUoxetine (PROZAC) 20 MG capsule, Take 20 mg by mouth daily., Disp: , Rfl:  ?  montelukast (SINGULAIR) 10 MG tablet, Take by mouth., Disp: , Rfl:  ?  mupirocin ointment (BACTROBAN) 2 %, Apply 1 application topically 2 (two) times daily., Disp: 30 g, Rfl: 0 ?  Probiotic Product (PROBIOTIC PO), Take 1 capsule by mouth daily., Disp: , Rfl:  ?  triamcinolone cream (KENALOG) 0.1 %, Apply 1 application topically 2 (two) times daily., Disp: 80 g, Rfl: 0 ? ?Allergies  ?Allergen Reactions  ? Ciprofloxacin   ?  Other reaction(s): Rash  ? Codeine Nausea And Vomiting  ? Septra [Sulfamethoxazole-Trimethoprim] Hives  ? Sertraline Hcl   ?  Other reaction(s): akathesia  ? ?Review of Systems ?Objective:  ?There were no vitals filed for this visit. ? ?General: Well developed, nourished, in no acute distress, alert and oriented x3  ? ?Dermatological: Skin is warm, dry and supple bilateral. Nails x 10 are thick yellow dystrophic possibly mycotic remaining integument appears to demonstrate mild tinea pedis.  At this  time. There are no open sores, no preulcerative lesions, no rash or signs of infection present. ? ?Vascular: Dorsalis Pedis artery and Posterior Tibial artery pedal pulses are 2/4 bilateral with immedate capillary fill time. Pedal hair growth present. No varicosities and no lower extremity edema present bilateral.  ? ?Neruologic: Grossly intact via light touch bilateral. Vibratory intact via tuning fork bilateral. Protective threshold with Semmes Wienstein monofilament intact to all pedal sites bilateral. Patellar and Achilles deep tendon reflexes 2+ bilateral. No Babinski or clonus noted bilateral.  ? ?Musculoskeletal: No gross boney pedal deformities bilateral. No pain, crepitus, or limitation noted with foot and ankle range of motion bilateral. Muscular strength 5/5 in all groups tested bilateral. ? ?Gait: Unassisted, Nonantalgic.  ? ? ?Radiographs: ? ?None taken ? ?Assessment & Plan:  ? ?Assessment: Dystrophic nails bilateral. ? ?Plan: Samples of the skin and nail were taken today for pathologic evaluation we will follow-up with her in 1 month. ? ? ? ? ?Zanylah Hardie T. [place], DPM ?

## 2022-03-19 DIAGNOSIS — L570 Actinic keratosis: Secondary | ICD-10-CM | POA: Diagnosis not present

## 2022-03-19 DIAGNOSIS — L853 Xerosis cutis: Secondary | ICD-10-CM | POA: Diagnosis not present

## 2022-03-19 DIAGNOSIS — D485 Neoplasm of uncertain behavior of skin: Secondary | ICD-10-CM | POA: Diagnosis not present

## 2022-03-19 DIAGNOSIS — L821 Other seborrheic keratosis: Secondary | ICD-10-CM | POA: Diagnosis not present

## 2022-03-19 DIAGNOSIS — L814 Other melanin hyperpigmentation: Secondary | ICD-10-CM | POA: Diagnosis not present

## 2022-03-21 ENCOUNTER — Telehealth: Payer: Self-pay | Admitting: *Deleted

## 2022-03-21 NOTE — Telephone Encounter (Signed)
Patient is calling because her left great toenail has partially come off, may lose the entire nail soon. Should she schedule to be seen for sooner appointment to be evaluated?Please advise.

## 2022-04-19 ENCOUNTER — Encounter: Payer: Self-pay | Admitting: Podiatry

## 2022-04-19 ENCOUNTER — Ambulatory Visit: Payer: Medicare HMO | Admitting: Podiatry

## 2022-04-19 DIAGNOSIS — Z79899 Other long term (current) drug therapy: Secondary | ICD-10-CM | POA: Diagnosis not present

## 2022-04-19 DIAGNOSIS — L603 Nail dystrophy: Secondary | ICD-10-CM

## 2022-04-19 MED ORDER — TERBINAFINE HCL 250 MG PO TABS: 250.0000 mg | ORAL_TABLET | Freq: Every day | ORAL | 0 refills | Status: DC

## 2022-04-19 NOTE — Progress Notes (Signed)
She presents today chief complaint of painful toenails.  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  Assessment: Onychomycosis.  Plan: We discussed the pros and cons of topical therapy laser therapy and oral therapy.  She would like to try oral therapy.  We did discuss the possible side effects and complications that are associated with this drug type.  She understands that and is amenable to it.  We will start her on Lamisil 250 mg tablets.  She will take 1 tablet daily.  We requested a complete metabolic panel for baseline evaluation and I will follow-up with her in 30 days for more blood work

## 2022-04-20 LAB — COMPREHENSIVE METABOLIC PANEL
AG Ratio: 2 (calc) (ref 1.0–2.5)
ALT: 16 U/L (ref 6–29)
AST: 17 U/L (ref 10–35)
Albumin: 4.5 g/dL (ref 3.6–5.1)
Alkaline phosphatase (APISO): 62 U/L (ref 37–153)
BUN: 16 mg/dL (ref 7–25)
CO2: 26 mmol/L (ref 20–32)
Calcium: 9.8 mg/dL (ref 8.6–10.4)
Chloride: 104 mmol/L (ref 98–110)
Creat: 0.89 mg/dL (ref 0.60–1.00)
Globulin: 2.2 g/dL (calc) (ref 1.9–3.7)
Glucose, Bld: 95 mg/dL (ref 65–139)
Potassium: 3.7 mmol/L (ref 3.5–5.3)
Sodium: 140 mmol/L (ref 135–146)
Total Bilirubin: 0.4 mg/dL (ref 0.2–1.2)
Total Protein: 6.7 g/dL (ref 6.1–8.1)

## 2022-04-23 ENCOUNTER — Telehealth: Payer: Self-pay | Admitting: *Deleted

## 2022-04-23 NOTE — Telephone Encounter (Signed)
-----   Message from Garrel Ridgel, [place] sent at 04/23/2022  6:59 AM EDT ----- Blood work looks great and may continue medication.

## 2022-05-22 ENCOUNTER — Telehealth: Payer: Self-pay | Admitting: Podiatry

## 2022-05-22 ENCOUNTER — Ambulatory Visit: Payer: Medicare HMO | Admitting: Podiatry

## 2022-05-22 NOTE — Telephone Encounter (Signed)
Pt cxled her appt today due to she is sick and we rescheduled it for 7.24. She is asking what should she do about the medication as she has finished her current one. She said the nails look great.

## 2022-05-22 NOTE — Telephone Encounter (Signed)
She will be on this medication until her nails clear completely. The July date should be fine.

## 2022-06-25 ENCOUNTER — Encounter: Payer: Self-pay | Admitting: Podiatry

## 2022-06-25 ENCOUNTER — Ambulatory Visit: Payer: Medicare HMO | Admitting: Podiatry

## 2022-06-25 DIAGNOSIS — L603 Nail dystrophy: Secondary | ICD-10-CM

## 2022-06-25 DIAGNOSIS — Z79899 Other long term (current) drug therapy: Secondary | ICD-10-CM

## 2022-06-25 MED ORDER — TERBINAFINE HCL 250 MG PO TABS: 250.0000 mg | ORAL_TABLET | Freq: Every day | ORAL | 0 refills | Status: DC

## 2022-06-25 NOTE — Progress Notes (Signed)
She presents today having completed her first 30 days of Lamisil.  She states that she has some slight diarrhea with it but other than that no fever chills nausea vomiting muscle aches pains calf pain back pain chest pain shortness of breath.  Objective: She states that she sees some change in the nails which does demonstrate a little bit of lightening of the nail.  Otherwise no significant changes on physical exam.  Assessment: Nail dystrophy long-term therapy onychomycosis with Lamisil.  Plan: Start her on her 90 days and requested a CMP.  Follow-up with her should this come back abnormal otherwise I will see her in 4 months

## 2022-06-27 DIAGNOSIS — Z79899 Other long term (current) drug therapy: Secondary | ICD-10-CM | POA: Diagnosis not present

## 2022-06-28 ENCOUNTER — Telehealth: Payer: Self-pay | Admitting: *Deleted

## 2022-06-28 LAB — COMPREHENSIVE METABOLIC PANEL
AG Ratio: 1.8 (calc) (ref 1.0–2.5)
ALT: 16 U/L (ref 6–29)
AST: 13 U/L (ref 10–35)
Albumin: 4.5 g/dL (ref 3.6–5.1)
Alkaline phosphatase (APISO): 69 U/L (ref 37–153)
BUN: 17 mg/dL (ref 7–25)
CO2: 24 mmol/L (ref 20–32)
Calcium: 9.7 mg/dL (ref 8.6–10.4)
Chloride: 105 mmol/L (ref 98–110)
Creat: 0.95 mg/dL (ref 0.60–1.00)
Globulin: 2.5 g/dL (calc) (ref 1.9–3.7)
Glucose, Bld: 91 mg/dL (ref 65–99)
Potassium: 4.2 mmol/L (ref 3.5–5.3)
Sodium: 139 mmol/L (ref 135–146)
Total Bilirubin: 0.4 mg/dL (ref 0.2–1.2)
Total Protein: 7 g/dL (ref 6.1–8.1)

## 2022-06-28 NOTE — Telephone Encounter (Signed)
-----   Message from Garrel Ridgel, [place] sent at 06/28/2022  6:47 AM EDT ----- Blood work looks good

## 2022-07-27 ENCOUNTER — Other Ambulatory Visit: Payer: Self-pay | Admitting: Podiatry

## 2022-10-22 DIAGNOSIS — Z1231 Encounter for screening mammogram for malignant neoplasm of breast: Secondary | ICD-10-CM | POA: Diagnosis not present

## 2022-10-22 DIAGNOSIS — Z8262 Family history of osteoporosis: Secondary | ICD-10-CM | POA: Diagnosis not present

## 2022-10-22 DIAGNOSIS — Z6826 Body mass index (BMI) 26.0-26.9, adult: Secondary | ICD-10-CM | POA: Diagnosis not present

## 2022-10-22 DIAGNOSIS — N958 Other specified menopausal and perimenopausal disorders: Secondary | ICD-10-CM | POA: Diagnosis not present

## 2022-10-22 DIAGNOSIS — E876 Hypokalemia: Secondary | ICD-10-CM | POA: Diagnosis not present

## 2022-10-22 DIAGNOSIS — Z01419 Encounter for gynecological examination (general) (routine) without abnormal findings: Secondary | ICD-10-CM | POA: Diagnosis not present

## 2022-10-22 DIAGNOSIS — M816 Localized osteoporosis [Lequesne]: Secondary | ICD-10-CM | POA: Diagnosis not present

## 2022-10-22 DIAGNOSIS — R32 Unspecified urinary incontinence: Secondary | ICD-10-CM | POA: Diagnosis not present

## 2022-10-23 ENCOUNTER — Ambulatory Visit: Payer: Medicare HMO | Admitting: Podiatry

## 2022-10-23 DIAGNOSIS — L603 Nail dystrophy: Secondary | ICD-10-CM

## 2022-10-23 MED ORDER — TERBINAFINE HCL 250 MG PO TABS: 250.0000 mg | ORAL_TABLET | Freq: Every day | ORAL | 0 refills | Status: AC

## 2022-10-28 NOTE — Progress Notes (Signed)
She presents today for follow-up of her onychomycosis and oral antifungal therapy.  She denies any problems taking the medicine she states that she just does not want to take it but she had because she had a discussion with primary care provider.  Objective: Vital signs stable alert and x 3 nails have been improving considerably, over the past several months and appear to be at 75% resolved at this point.  Assessment: Resolving onychomycosis long-term therapy with Lamisil.  Plan: Discussed etiology pathology and surgical therapies at this point if she would like to take the medication within the provided for her she will take 1 tablet every other day by mouth.  Will follow-up with her in 3 months if necessary.

## 2022-12-18 DIAGNOSIS — R030 Elevated blood-pressure reading, without diagnosis of hypertension: Secondary | ICD-10-CM | POA: Diagnosis not present

## 2022-12-18 DIAGNOSIS — R7989 Other specified abnormal findings of blood chemistry: Secondary | ICD-10-CM | POA: Diagnosis not present

## 2022-12-25 DIAGNOSIS — Z1339 Encounter for screening examination for other mental health and behavioral disorders: Secondary | ICD-10-CM | POA: Diagnosis not present

## 2022-12-25 DIAGNOSIS — J00 Acute nasopharyngitis [common cold]: Secondary | ICD-10-CM | POA: Diagnosis not present

## 2022-12-25 DIAGNOSIS — R82998 Other abnormal findings in urine: Secondary | ICD-10-CM | POA: Diagnosis not present

## 2022-12-25 DIAGNOSIS — Z0000 Encounter for general adult medical examination without abnormal findings: Secondary | ICD-10-CM | POA: Diagnosis not present

## 2022-12-25 DIAGNOSIS — K219 Gastro-esophageal reflux disease without esophagitis: Secondary | ICD-10-CM | POA: Diagnosis not present

## 2022-12-25 DIAGNOSIS — R030 Elevated blood-pressure reading, without diagnosis of hypertension: Secondary | ICD-10-CM | POA: Diagnosis not present

## 2022-12-25 DIAGNOSIS — Z1331 Encounter for screening for depression: Secondary | ICD-10-CM | POA: Diagnosis not present

## 2022-12-25 DIAGNOSIS — F5104 Psychophysiologic insomnia: Secondary | ICD-10-CM | POA: Diagnosis not present

## 2023-01-04 DIAGNOSIS — M810 Age-related osteoporosis without current pathological fracture: Secondary | ICD-10-CM | POA: Diagnosis not present

## 2023-01-04 DIAGNOSIS — Z7189 Other specified counseling: Secondary | ICD-10-CM | POA: Diagnosis not present

## 2023-01-24 ENCOUNTER — Ambulatory Visit: Payer: Medicare HMO | Admitting: Podiatry

## 2023-03-07 ENCOUNTER — Ambulatory Visit
Admission: RE | Admit: 2023-03-07 | Discharge: 2023-03-07 | Disposition: A | Discharge status: Home / Self Care | Payer: Medicare HMO | Source: Ambulatory Visit | Attending: Urgent Care | Admitting: Urgent Care

## 2023-03-07 VITALS — BP 157/79 | HR 74 | Temp 98.4°F | Resp 20

## 2023-03-07 DIAGNOSIS — J988 Other specified respiratory disorders: Secondary | ICD-10-CM

## 2023-03-07 DIAGNOSIS — J309 Allergic rhinitis, unspecified: Secondary | ICD-10-CM

## 2023-03-07 DIAGNOSIS — B9789 Other viral agents as the cause of diseases classified elsewhere: Secondary | ICD-10-CM

## 2023-03-07 MED ORDER — PREDNISONE 10 MG PO TABS: 30.0000 mg | ORAL_TABLET | Freq: Every day | ORAL | 0 refills | Status: AC

## 2023-03-07 MED ORDER — PROMETHAZINE-DM 6.25-15 MG/5ML PO SYRP: 2.5000 mL | ORAL_SOLUTION | Freq: Three times a day (TID) | ORAL | 0 refills | Status: AC | PRN

## 2023-03-07 NOTE — ED Triage Notes (Signed)
Pt c/o prod cough, chest congestion sx started 3/31-NAD-steady gait

## 2023-03-07 NOTE — ED Provider Notes (Signed)
[facility]  Note:  This document was prepared using Systems analyst and may include unintentional dictation errors.  MRN: [id number] DOB: 1951-04-01  Subjective:   Alicia Dominguez is a 72 y.o. female presenting for 5 day history of productive cough, mild wheezing, chest congestion. Had a fever yesterday, throat pain. No asthma. Has allergies, takes Allegra. No smoking of any kind including cigarettes, cigars, vaping, marijuana use.    No current facility-administered medications for this encounter.  Current Outpatient Medications:    esomeprazole (NEXIUM) 20 MG capsule, Take 20 mg by mouth daily., Disp: , Rfl:    fexofenadine (ALLEGRA) 60 MG tablet, Take 60 mg by mouth 2 (two) times daily., Disp: , Rfl:    FLUoxetine (PROZAC) 20 MG capsule, Take 20 mg by mouth daily., Disp: , Rfl:    Probiotic Product (PROBIOTIC PO), Take 1 capsule by mouth daily., Disp: , Rfl:    terbinafine (LAMISIL) 250 MG tablet, Take 1 tablet (250 mg total) by mouth daily., Disp: 30 tablet, Rfl: 0   Allergies  Allergen Reactions   Ciprofloxacin     Other reaction(s): Rash   Codeine Nausea And Vomiting   Septra [Sulfamethoxazole-Trimethoprim] Hives   Sertraline Hcl     Other reaction(s): akathesia    Past Medical History:  Diagnosis Date   Anxiety      Past Surgical History:  Procedure Laterality Date   DILATION AND CURETTAGE OF UTERUS      Family History  Problem Relation Age of Onset   Hypertension Mother    Alzheimer's disease Mother    Diabetes Mother    Lung cancer Father     Social History   Tobacco Use   Smoking status: Never   Smokeless tobacco: Never  Vaping Use   Vaping Use: Never used  Substance Use Topics   Alcohol use: No   Drug use: No    ROS   Objective:   Vitals: BP (!) 157/79 (BP Location: Right Arm)   Pulse 74   Temp 98.4 F (36.9 C) (Oral)   Resp 20   SpO2 96%   Physical Exam Constitutional:      General: She  is not in acute distress.    Appearance: Normal appearance. She is well-developed and normal weight. She is not ill-appearing, toxic-appearing or diaphoretic.  HENT:     Head: Normocephalic and atraumatic.     Right Ear: Tympanic membrane, ear canal and external ear normal. No drainage or tenderness. No middle ear effusion. There is no impacted cerumen. Tympanic membrane is not erythematous or bulging.     Left Ear: Tympanic membrane, ear canal and external ear normal. No drainage or tenderness.  No middle ear effusion. There is no impacted cerumen. Tympanic membrane is not erythematous or bulging.     Nose: Congestion present. No rhinorrhea.     Mouth/Throat:     Mouth: Mucous membranes are moist. No oral lesions.     Pharynx: No pharyngeal swelling, oropharyngeal exudate, posterior oropharyngeal erythema or uvula swelling.     Tonsils: No tonsillar exudate or tonsillar abscesses.     Comments: Thick postnasal drainage overlying pharynx. Eyes:     General: No scleral icterus.       Right eye: No discharge.        Left eye: No discharge.     Extraocular Movements: Extraocular movements intact.     Right eye: Normal extraocular motion.     Left eye: Normal extraocular motion.  Conjunctiva/sclera: Conjunctivae normal.  Cardiovascular:     Rate and Rhythm: Normal rate and regular rhythm.     Heart sounds: Normal heart sounds. No murmur heard.    No friction rub. No gallop.  Pulmonary:     Effort: Pulmonary effort is normal. No respiratory distress.     Breath sounds: No stridor. No wheezing, rhonchi or rales.  Chest:     Chest wall: No tenderness.  Musculoskeletal:     Cervical back: Normal range of motion and neck supple.  Lymphadenopathy:     Cervical: No cervical adenopathy.  Skin:    General: Skin is warm and dry.  Neurological:     General: No focal deficit present.     Mental Status: She is alert and oriented to person, place, and time.  Psychiatric:        Mood and  Affect: Mood normal.        Behavior: Behavior normal.     Assessment and Plan :   PDMP not reviewed this encounter.  1. Viral respiratory infection   2. Allergic rhinitis, unspecified seasonality, unspecified trigger     Deferred imaging given clear cardiopulmonary exam, hemodynamically stable vital signs.  Given her significant respiratory symptoms, allergic rhinitis recommended oral prednisone course which patient would like to trial.  Suspect viral URI, viral syndrome. Physical exam findings reassuring and vital signs stable for discharge. Advised supportive care, offered symptomatic relief. Counseled patient on potential for adverse effects with medications prescribed/recommended today, ER and return-to-clinic precautions discussed, patient verbalized understanding.     Jaynee Eagles, [place] 03/07/23 [phone]

## 2023-03-07 NOTE — Discharge Instructions (Signed)
We will manage this as a viral syndrome. For sore throat or cough try using a honey-based tea. Use 3 teaspoons of honey with juice squeezed from half lemon. Place shaved pieces of ginger into 1/2-1 cup of water and warm over stove top. Then mix the ingredients and repeat every 4 hours as needed. Please take Tylenol 500mg-650mg once every 6 hours for fevers, aches and pains. Hydrate very well with at least 2 liters (64 ounces) of water. Eat light meals such as soups (chicken and noodles, chicken wild rice, vegetable).  Do not eat any foods that you are allergic to.  Start an antihistamine like Zyrtec (10mg daily) for postnasal drainage, sinus congestion.  You can take this together with prednisone. Use cough medications as needed.  

## 2023-05-06 DIAGNOSIS — D225 Melanocytic nevi of trunk: Secondary | ICD-10-CM | POA: Diagnosis not present

## 2023-05-06 DIAGNOSIS — L821 Other seborrheic keratosis: Secondary | ICD-10-CM | POA: Diagnosis not present

## 2023-05-06 DIAGNOSIS — L570 Actinic keratosis: Secondary | ICD-10-CM | POA: Diagnosis not present

## 2023-05-06 DIAGNOSIS — L820 Inflamed seborrheic keratosis: Secondary | ICD-10-CM | POA: Diagnosis not present

## 2023-07-11 DIAGNOSIS — W109XXA Fall (on) (from) unspecified stairs and steps, initial encounter: Secondary | ICD-10-CM | POA: Diagnosis not present

## 2023-07-11 DIAGNOSIS — S8001XA Contusion of right knee, initial encounter: Secondary | ICD-10-CM | POA: Diagnosis not present

## 2023-07-11 DIAGNOSIS — M25561 Pain in right knee: Secondary | ICD-10-CM | POA: Diagnosis not present

## 2023-10-01 DIAGNOSIS — H18613 Keratoconus, stable, bilateral: Secondary | ICD-10-CM | POA: Diagnosis not present

## 2023-10-01 DIAGNOSIS — H5213 Myopia, bilateral: Secondary | ICD-10-CM | POA: Diagnosis not present

## 2023-10-01 DIAGNOSIS — H2513 Age-related nuclear cataract, bilateral: Secondary | ICD-10-CM | POA: Diagnosis not present

## 2023-10-01 DIAGNOSIS — Z135 Encounter for screening for eye and ear disorders: Secondary | ICD-10-CM | POA: Diagnosis not present

## 2024-01-06 DIAGNOSIS — Z79899 Other long term (current) drug therapy: Secondary | ICD-10-CM | POA: Diagnosis not present

## 2024-01-06 DIAGNOSIS — R7989 Other specified abnormal findings of blood chemistry: Secondary | ICD-10-CM | POA: Diagnosis not present

## 2024-01-13 DIAGNOSIS — F419 Anxiety disorder, unspecified: Secondary | ICD-10-CM | POA: Diagnosis not present

## 2024-01-13 DIAGNOSIS — F32A Depression, unspecified: Secondary | ICD-10-CM | POA: Diagnosis not present

## 2024-01-13 DIAGNOSIS — R82998 Other abnormal findings in urine: Secondary | ICD-10-CM | POA: Diagnosis not present

## 2024-01-13 DIAGNOSIS — Z23 Encounter for immunization: Secondary | ICD-10-CM | POA: Diagnosis not present

## 2024-01-13 DIAGNOSIS — Z0000 Encounter for general adult medical examination without abnormal findings: Secondary | ICD-10-CM | POA: Diagnosis not present

## 2024-01-13 DIAGNOSIS — R030 Elevated blood-pressure reading, without diagnosis of hypertension: Secondary | ICD-10-CM | POA: Diagnosis not present

## 2024-01-13 DIAGNOSIS — M170 Bilateral primary osteoarthritis of knee: Secondary | ICD-10-CM | POA: Diagnosis not present

## 2024-01-13 DIAGNOSIS — K219 Gastro-esophageal reflux disease without esophagitis: Secondary | ICD-10-CM | POA: Diagnosis not present

## 2024-05-28 DIAGNOSIS — L430 Hypertrophic lichen planus: Secondary | ICD-10-CM | POA: Diagnosis not present

## 2024-05-28 DIAGNOSIS — L570 Actinic keratosis: Secondary | ICD-10-CM | POA: Diagnosis not present

## 2024-05-28 DIAGNOSIS — D485 Neoplasm of uncertain behavior of skin: Secondary | ICD-10-CM | POA: Diagnosis not present

## 2024-05-28 DIAGNOSIS — L820 Inflamed seborrheic keratosis: Secondary | ICD-10-CM | POA: Diagnosis not present

## 2024-05-28 DIAGNOSIS — D1801 Hemangioma of skin and subcutaneous tissue: Secondary | ICD-10-CM | POA: Diagnosis not present

## 2024-09-17 DIAGNOSIS — H903 Sensorineural hearing loss, bilateral: Secondary | ICD-10-CM | POA: Diagnosis not present

## 2024-09-17 DIAGNOSIS — H9313 Tinnitus, bilateral: Secondary | ICD-10-CM | POA: Diagnosis not present
